# Patient Record
Sex: Male | Born: 1990 | Race: Black or African American | Hispanic: No | Marital: Single | Smoking: Current every day smoker
Health system: Southern US, Community
[De-identification: ages and names within clinical notes are randomized; demographics above are authoritative.]

## PROBLEM LIST (undated history)

## (undated) DIAGNOSIS — S42022A Displaced fracture of shaft of left clavicle, initial encounter for closed fracture: Secondary | ICD-10-CM

---
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
## 2017-05-08 ENCOUNTER — Encounter (HOSPITAL_COMMUNITY): Payer: Self-pay | Admitting: *Deleted

## 2017-05-08 ENCOUNTER — Other Ambulatory Visit: Payer: Self-pay

## 2017-05-08 ENCOUNTER — Emergency Department (HOSPITAL_COMMUNITY)
Admission: EM | Admit: 2017-05-08 | Discharge: 2017-05-09 | Disposition: A | Discharge status: Home / Self Care | Payer: Self-pay | Attending: Emergency Medicine | Admitting: Emergency Medicine

## 2017-05-08 DIAGNOSIS — F17210 Nicotine dependence, cigarettes, uncomplicated: Secondary | ICD-10-CM | POA: Insufficient documentation

## 2017-05-08 DIAGNOSIS — Z202 Contact with and (suspected) exposure to infections with a predominantly sexual mode of transmission: Secondary | ICD-10-CM | POA: Insufficient documentation

## 2017-05-08 NOTE — ED Triage Notes (Signed)
Pt's girlfriend was diagnosed with gonorrhea today. Pt has not been tested, is requesting medications. Denies urinary symptoms or discharge

## 2017-05-09 ENCOUNTER — Encounter (HOSPITAL_COMMUNITY): Payer: Self-pay | Admitting: Student

## 2017-05-09 LAB — URINALYSIS, ROUTINE W REFLEX MICROSCOPIC
Bilirubin Urine: NEGATIVE
Glucose, UA: NEGATIVE mg/dL
Hgb urine dipstick: NEGATIVE
KETONES UR: NEGATIVE mg/dL
LEUKOCYTES UA: NEGATIVE
NITRITE: NEGATIVE
PROTEIN: NEGATIVE mg/dL
Specific Gravity, Urine: 1.029 (ref 1.005–1.030)
pH: 5 (ref 5.0–8.0)

## 2017-05-09 LAB — GC/CHLAMYDIA PROBE AMP (~~LOC~~) NOT AT ARMC
CHLAMYDIA, DNA PROBE: NEGATIVE
NEISSERIA GONORRHEA: NEGATIVE

## 2017-05-09 MED ORDER — AZITHROMYCIN 250 MG PO TABS
1000.0000 mg | ORAL_TABLET | Freq: Once | ORAL | Status: AC
  Administered 2017-05-09: 1000 mg via ORAL
  Filled 2017-05-09: qty 4

## 2017-05-09 MED ORDER — CEFTRIAXONE SODIUM 250 MG IJ SOLR
250.0000 mg | Freq: Once | INTRAMUSCULAR | Status: AC
  Administered 2017-05-09: 250 mg via INTRAMUSCULAR
  Filled 2017-05-09: qty 250

## 2017-05-09 MED ORDER — LIDOCAINE HCL (PF) 1 % IJ SOLN
5.0000 mL | Freq: Once | INTRAMUSCULAR | Status: AC
Start: 2017-05-09 — End: 2017-05-09
  Administered 2017-05-09: 5 mL
  Filled 2017-05-09: qty 5

## 2017-05-09 NOTE — ED Provider Notes (Signed)
[facility] EMERGENCY DEPARTMENT Provider Note   CSN: [id number] Arrival date & time: 05/08/17  2039     History   Chief Complaint Chief Complaint  Patient presents with  . Exposure to STD    HPI DELDRICK Graham is a 27 y.o. male with a hx tobacco abuse who presents to the ED requesting STD prophlaxis treatment today after girlfriend tested positive for gonorrhea. Patient states he has had some intermittent urinary frequency and urgency over past 2 days, no alleviating/aggravating factors, no other sxs. Denies fever, chills, nausea, vomiting, penile discharge, abdominal pain, testicular pain/swelling, or pain with bowel movements.   HPI  History reviewed. No pertinent past medical history.  There are no active problems to display for this patient.   History reviewed. No pertinent surgical history.     Home Medications    Prior to Admission medications   Not on File    Family History History reviewed. No pertinent family history.  Social History Social History   Tobacco Use  . Smoking status: Current Every Day Smoker  . Smokeless tobacco: Never Used  Substance Use Topics  . Alcohol use: Yes  . Drug use: No     Allergies   Patient has no known allergies.   Review of Systems Review of Systems  Constitutional: Negative for chills and fever.  Gastrointestinal: Negative for abdominal pain, constipation, diarrhea, nausea and vomiting.  Genitourinary: Positive for frequency and urgency. Negative for discharge, dysuria, flank pain, genital sores, hematuria, penile pain, scrotal swelling and testicular pain.    Physical Exam Updated Vital Signs BP 135/75   Pulse 66   Temp 98.8 F (37.1 C)   Resp 16   SpO2 100%   Physical Exam  Constitutional: He appears well-developed and well-nourished. No distress.  HENT:  Head: Normocephalic and atraumatic.  Eyes: Conjunctivae are normal. Right eye exhibits no discharge. Left eye exhibits no  discharge.  Pulmonary/Chest: Effort normal.  Abdominal: Soft. He exhibits no distension. There is no tenderness. There is no rebound and no guarding.  Genitourinary: Testes normal. Right testis shows no mass, no swelling and no tenderness. Left testis shows no mass, no swelling and no tenderness. Circumcised. No penile erythema or penile tenderness. No discharge found.  Genitourinary Comments: RN Ricky Graham present throughout exam as chaperone.   Neurological: He is alert.  Clear speech.   Psychiatric: He has a normal mood and affect. His behavior is normal. Thought content normal.  Nursing note and vitals reviewed.   ED Treatments / Results  Labs Results for orders placed or performed during the hospital encounter of 05/08/17  Urinalysis, Routine w reflex microscopic  Result Value Ref Range   Color, Urine YELLOW YELLOW   APPearance CLEAR CLEAR   Specific Gravity, Urine 1.029 1.005 - 1.030   pH 5.0 5.0 - 8.0   Glucose, UA NEGATIVE NEGATIVE mg/dL   Hgb urine dipstick NEGATIVE NEGATIVE   Bilirubin Urine NEGATIVE NEGATIVE   Ketones, ur NEGATIVE NEGATIVE mg/dL   Protein, ur NEGATIVE NEGATIVE mg/dL   Nitrite NEGATIVE NEGATIVE   Leukocytes, UA NEGATIVE NEGATIVE   No results found. EKG  EKG Interpretation None       Radiology No results found.  Procedures Procedures (including critical care time)  Medications Ordered in ED Medications  azithromycin (ZITHROMAX) tablet 1,000 mg (1,000 mg Oral Given 05/09/17 0033)  cefTRIAXone (ROCEPHIN) injection 250 mg (250 mg Intramuscular Given 05/09/17 0034)  lidocaine (PF) (XYLOCAINE) 1 % injection 5 mL (5 mLs  Other Given 05/09/17 0034)    Initial Impression / Assessment and Plan / ED Course  I have reviewed the triage vital signs and the nursing notes.  Pertinent labs & imaging results that were available during my care of the patient were reviewed by me and considered in my medical decision making (see chart for details).  Patient  presents requesting STD treatment after girlfriend tested positive for gonorrhea. Patient is nontoxic appearing, in no apparent distress, vitals are WNL. States he has had some urgency/frequency, no other complaints. Patient is afebrile without abdominal tenderness, abdominal pain or painful bowel movements to indicate prostatitis.  No tenderness to palpation of the testes or epididymis to suggest orchitis or epididymitis.  STD cultures obtained including  gonorrhea and chlamydia, patient declined testing for HIV or syphilis. UA grossly unremarkable. Patient has been treated prophylactically with azithromycin and Rocephin in the ED. Patient to be discharged with instructions to follow up with PCP or the health department. Discussed importance of using protection when sexually active. Pt understands that they have GC/Chlamydia cultures pending and that they will need to inform all sexual partners if results return positive. Discussed treatment plan, need for follow-up, and return precautions with the patient. Provided opportunity for questions, patient confirmed understanding and is in agreement with plan.   Final Clinical Impressions(s) / ED Diagnoses   Final diagnoses:  STD exposure    ED Discharge Orders    None       Cherly Andersonetrucelli, Alis Sawchuk R, PA-C 05/09/17 0046    Glynn Octaveancour, Stephen, MD 05/09/17 [phone]

## 2017-05-09 NOTE — Discharge Instructions (Signed)
You were seen in the emergency department today and treated prophylactically for gonorrhea and chlamydia, do not engage in intercourse for the next 7 days to avoid transmission if your tests are positive.  Your test results are pending, we will call you with results.  If the results are positive you will need to inform all sexual partners.   Follow-up with the health department or your primary care provider in 1 week for reevaluation.  Return to the emergency department for new or worsening symptoms including but not limited to penile discharge, abdominal pain, testicular pain or swelling, nausea, vomiting, fever, or any other concerns you may have.

## 2017-05-22 ENCOUNTER — Emergency Department (HOSPITAL_COMMUNITY)
Admission: EM | Admit: 2017-05-22 | Discharge: 2017-05-22 | Disposition: A | Discharge status: Home / Self Care | Payer: Self-pay | Attending: Emergency Medicine | Admitting: Emergency Medicine

## 2017-05-22 ENCOUNTER — Other Ambulatory Visit: Payer: Self-pay

## 2017-05-22 ENCOUNTER — Encounter (HOSPITAL_COMMUNITY): Payer: Self-pay

## 2017-05-22 DIAGNOSIS — F17210 Nicotine dependence, cigarettes, uncomplicated: Secondary | ICD-10-CM | POA: Insufficient documentation

## 2017-05-22 DIAGNOSIS — Z202 Contact with and (suspected) exposure to infections with a predominantly sexual mode of transmission: Secondary | ICD-10-CM | POA: Insufficient documentation

## 2017-05-22 MED ORDER — METRONIDAZOLE 500 MG PO TABS
2000.0000 mg | ORAL_TABLET | Freq: Once | ORAL | Status: AC
  Administered 2017-05-22: 2000 mg via ORAL
  Filled 2017-05-22: qty 4

## 2017-05-22 NOTE — Discharge Instructions (Signed)
°  Use a condom with every sexual encounter Follow up with your doctor in regards to today's visit.   Please return to the ER for fevers, vomiting, new or worsening symptoms, any additional concerns.

## 2017-05-22 NOTE — ED Provider Notes (Signed)
[facility] EMERGENCY DEPARTMENT Provider Note   CSN: [id number] Arrival date & time: 05/22/17  0216     History   Chief Complaint Chief Complaint  Patient presents with  . Exposure to STD    HPI HAYZE GAZDA is a 27 y.o. male.  The history is provided by the patient and medical records. No language interpreter was used.  Exposure to STD  Pertinent negatives include no abdominal pain.   MACKEY VARRICCHIO is a 27 y.o. male  with no known PMH who presents to the Emergency Department for STD treatment.  Patient's girlfriend was seen in the emergency department today by me and diagnosed with trichomonas.  After being informed of diagnosis, patient took 10 for treatment.  He has recently had treatment for gonorrhea and chlamydia the last 2 weeks, but not for trichomonas.  He is having no symptoms.  No abdominal pain, penile discharge, penile/scrotal pain or swelling.   History reviewed. No pertinent past medical history.  There are no active problems to display for this patient.   History reviewed. No pertinent surgical history.     Home Medications    Prior to Admission medications   Not on File    Family History No family history on file.  Social History Social History   Tobacco Use  . Smoking status: Current Every Day Smoker  . Smokeless tobacco: Never Used  Substance Use Topics  . Alcohol use: Yes  . Drug use: No     Allergies   Patient has no known allergies.   Review of Systems Review of Systems  Constitutional: Negative for chills and fever.  Gastrointestinal: Negative for abdominal pain, constipation, diarrhea, nausea and vomiting.  Genitourinary: Negative for difficulty urinating, discharge, dysuria, frequency, penile pain, penile swelling, scrotal swelling, testicular pain and urgency.  Musculoskeletal: Negative for back pain.     Physical Exam Updated Vital Signs BP 130/68 (BP Location: Right Arm)   Pulse 65   Temp 98.4 F  (36.9 C) (Oral)   Resp 18   SpO2 98%   Physical Exam  Constitutional: He appears well-developed and well-nourished. No distress.  HENT:  Head: Normocephalic and atraumatic.  Neck: Neck supple.  Cardiovascular: Normal rate, regular rhythm and normal heart sounds.  No murmur heard. Pulmonary/Chest: Effort normal and breath sounds normal. No respiratory distress. He has no wheezes. He has no rales.  Abdominal:  No abdominal tenderness.  Genitourinary:  Genitourinary Comments: Chaperone present for exam. No discharge from penis. No signs of lesion or erythema on the penis or testicles. The penis and testicles are nontender. No testicular masses or swelling.  Neurological: He is alert.  Skin: Skin is warm and dry.  Nursing note and vitals reviewed.    ED Treatments / Results  Labs (all labs ordered are listed, but only abnormal results are displayed) Labs Reviewed - No data to display  EKG  EKG Interpretation None       Radiology No results found.  Procedures Procedures (including critical care time)  Medications Ordered in ED Medications  metroNIDAZOLE (FLAGYL) tablet 2,000 mg (2,000 mg Oral Given 05/22/17 0245)     Initial Impression / Assessment and Plan / ED Course  I have reviewed the triage vital signs and the nursing notes.  Pertinent labs & imaging results that were available during my care of the patient were reviewed by me and considered in my medical decision making (see chart for details).    D[facility] presents  to ED for STD treatment.  His girlfriend was seen in the emergency department by me earlier tonight and diagnosed with trichomonas.  He is having no symptoms.  Benign exam.  Treated with Flagyl in ED.  Already received gonorrhea and Chlamydia treatment about 2 weeks ago. Health department follow-up information provided.  All questions answered.   Final Clinical Impressions(s) / ED Diagnoses   Final diagnoses:  STD exposure    ED Discharge  Orders    None       Ward, Chase PicketJaime Pilcher, PA-C 05/22/17 [id number]    Geoffery Lyonselo, Douglas, MD 05/22/17 [phone]

## 2017-05-22 NOTE — ED Triage Notes (Signed)
Pt reports that his girlfriend has tric and he wants to be treated

## 2017-06-21 ENCOUNTER — Encounter (HOSPITAL_COMMUNITY): Payer: Self-pay

## 2017-06-21 ENCOUNTER — Emergency Department (HOSPITAL_COMMUNITY): Payer: Self-pay

## 2017-06-21 ENCOUNTER — Other Ambulatory Visit: Payer: Self-pay

## 2017-06-21 ENCOUNTER — Inpatient Hospital Stay (HOSPITAL_COMMUNITY)
Admission: EM | Admit: 2017-06-21 | Discharge: 2017-06-27 | DRG: 558 | Disposition: A | Discharge status: Home / Self Care | Payer: Self-pay | Attending: Internal Medicine | Admitting: Internal Medicine

## 2017-06-21 DIAGNOSIS — M6282 Rhabdomyolysis: Principal | ICD-10-CM

## 2017-06-21 DIAGNOSIS — Y93B9 Activity, other involving muscle strengthening exercises: Secondary | ICD-10-CM

## 2017-06-21 DIAGNOSIS — F17200 Nicotine dependence, unspecified, uncomplicated: Secondary | ICD-10-CM | POA: Diagnosis present

## 2017-06-21 LAB — URINALYSIS, ROUTINE W REFLEX MICROSCOPIC
BILIRUBIN URINE: NEGATIVE
GLUCOSE, UA: NEGATIVE mg/dL
Ketones, ur: NEGATIVE mg/dL
Leukocytes, UA: NEGATIVE
NITRITE: NEGATIVE
Protein, ur: 100 mg/dL — AB
SPECIFIC GRAVITY, URINE: 1.02 (ref 1.005–1.030)
pH: 6 (ref 5.0–8.0)

## 2017-06-21 LAB — BASIC METABOLIC PANEL
ANION GAP: 12 (ref 5–15)
BUN: 10 mg/dL (ref 6–20)
CO2: 24 mmol/L (ref 22–32)
Calcium: 9.3 mg/dL (ref 8.9–10.3)
Chloride: 101 mmol/L (ref 101–111)
Creatinine, Ser: 0.76 mg/dL (ref 0.61–1.24)
Glucose, Bld: 93 mg/dL (ref 65–99)
POTASSIUM: 4.5 mmol/L (ref 3.5–5.1)
SODIUM: 137 mmol/L (ref 135–145)

## 2017-06-21 LAB — CK: Total CK: >50000 U/L — ABNORMAL HIGH (ref 49–397)

## 2017-06-21 LAB — CBC WITH DIFFERENTIAL/PLATELET
BASOS ABS: 0 10*3/uL (ref 0.0–0.1)
BASOS PCT: 0 %
EOS PCT: 1 %
Eosinophils Absolute: 0 10*3/uL (ref 0.0–0.7)
HEMATOCRIT: 49.8 % (ref 39.0–52.0)
Hemoglobin: 16.9 g/dL (ref 13.0–17.0)
LYMPHS PCT: 27 %
Lymphs Abs: 1.8 10*3/uL (ref 0.7–4.0)
MCH: 29.1 pg (ref 26.0–34.0)
MCHC: 33.9 g/dL (ref 30.0–36.0)
MCV: 85.9 fL (ref 78.0–100.0)
Monocytes Absolute: 0.5 10*3/uL (ref 0.1–1.0)
Monocytes Relative: 7 %
NEUTROS ABS: 4.3 10*3/uL (ref 1.7–7.7)
Neutrophils Relative %: 65 %
PLATELETS: 251 10*3/uL (ref 150–400)
RBC: 5.8 MIL/uL (ref 4.22–5.81)
RDW: 13.4 % (ref 11.5–15.5)
WBC: 6.7 10*3/uL (ref 4.0–10.5)

## 2017-06-21 MED ORDER — METHOCARBAMOL 500 MG PO TABS
500.0000 mg | ORAL_TABLET | Freq: Four times a day (QID) | ORAL | Status: DC | PRN
  Administered 2017-06-22 – 2017-06-23 (×5): 500 mg via ORAL
  Filled 2017-06-21 (×5): qty 1

## 2017-06-21 MED ORDER — FENTANYL CITRATE (PF) 100 MCG/2ML IJ SOLN
50.0000 ug | Freq: Once | INTRAMUSCULAR | Status: AC
  Administered 2017-06-21: 50 ug via INTRAVENOUS
  Filled 2017-06-21: qty 2

## 2017-06-21 MED ORDER — METHOCARBAMOL 500 MG PO TABS
500.0000 mg | ORAL_TABLET | Freq: Once | ORAL | Status: AC
  Administered 2017-06-21: 500 mg via ORAL
  Filled 2017-06-21: qty 1

## 2017-06-21 MED ORDER — SODIUM CHLORIDE 0.9 % IV BOLUS
1000.0000 mL | Freq: Once | INTRAVENOUS | Status: AC
  Administered 2017-06-21: 1000 mL via INTRAVENOUS

## 2017-06-21 MED ORDER — HYDROMORPHONE HCL 1 MG/ML IJ SOLN
0.5000 mg | INTRAMUSCULAR | Status: DC | PRN
  Administered 2017-06-22: 0.5 mg via INTRAVENOUS
  Filled 2017-06-21: qty 1

## 2017-06-21 NOTE — H&P (Signed)
[facility] H&P   Patient Demographics:    Ricky Graham, is a 27 y.o. male  MRN: [id number]   DOB - 1990/10/18  Admit Date - 06/21/2017  Outpatient Primary MD for the patient is Patient, No Pcp Per  Referring MD/NP/PA:  Providence Lanius  Outpatient Specialists:   Patient coming from: home  Chief Complaint  Patient presents with  . bilateral arm pain      HPI:    Ricky Graham  is a 27 y.o. male, w no significant PMHx, who presents with c/o achiness of his arms bilaterally.  Pt denies being on any medication, no coccaine use.  Pt denies dehydration.  Does admit to lifting a few days ago. No family hx of rhabdomyolysis.   In ED,  Xray left elbow IMPRESSION: Negative.   Na 137, K 4.5,   Bun 10, Creatinine 0.76 Wbc 6.7, Hgb 16.9, Plt 251  CPK >50,000  Pt admits to lifting 80lbs at work all the time.  Pt will be admitted for rhabdomyolysis.           Review of systems:    In addition to the HPI above,  No Fever-chills, No Headache, No changes with Vision or hearing, No problems swallowing food or Liquids, No Chest pain, Cough or Shortness of Breath, No Abdominal pain, No Nausea or Vommitting, Bowel movements are regular, No Blood in stool or Urine, No dysuria, No new skin rashes or bruises, No new joints pains-aches,   No recent weight gain or loss, No polyuria, polydypsia or polyphagia, No significant Mental Stressors.  A full 10 point Review of Systems was done, except as stated above, all other Review of Systems were negative.   With Past History of the following :    History reviewed. No pertinent past medical history.    History reviewed. No pertinent surgical history.    Social History:     Social History   Tobacco Use  . Smoking status: Current Every Day Smoker  . Smokeless tobacco: Never Used  Substance Use Topics  . Alcohol use: Yes       Lives - at home  Mobility - walks by self   Family History :     Family History  Problem Relation Age of Onset  . Hypertension Mother      Home Medications:   Prior to Admission medications   Not on File     Allergies:    No Known Allergies   Physical Exam:   Vitals  Blood pressure (!) 136/103, pulse 60, temperature 98.3 F (36.8 C), temperature source Oral, resp. rate 16, height 6' (1.829 m), weight 81.6 kg (180 lb), SpO2 100 %.   1. General  lying in bed in NAD,    2. Normal affect and insight, Not Suicidal or Homicidal, Awake Alert, Oriented X 3.  3. No F.N deficits, ALL C.Nerves Intact, Strength 5/5  all 4 extremities, Sensation intact all 4 extremities, Plantars down going.  4. Ears and Eyes appear Normal, Conjunctivae clear, PERRLA. Moist Oral Mucosa.  5. Supple Neck, No JVD, No cervical lymphadenopathy appriciated, No Carotid Bruits.  6. Symmetrical Chest wall movement, Good air movement bilaterally, CTAB.  7. RRR, No Gallops, Rubs or Murmurs, No Parasternal Heave.  8. Positive Bowel Sounds, Abdomen Soft, No tenderness, No organomegaly appriciated,No rebound -guarding or rigidity.  9.  No Cyanosis, Normal Skin Turgor, No Skin Rash or Bruise.  10. Good muscle tone,  joints appear normal , no effusions, Normal ROM.  11. No Palpable Lymph Nodes in Neck or Axillae     Data Review:    CBC Recent Labs  Lab 06/21/17 1748  WBC 6.7  HGB 16.9  HCT 49.8  PLT 251  MCV 85.9  MCH 29.1  MCHC 33.9  RDW 13.4  LYMPHSABS 1.8  MONOABS 0.5  EOSABS 0.0  BASOSABS 0.0   ------------------------------------------------------------------------------------------------------------------  Chemistries  Recent Labs  Lab 06/21/17 1748  NA 137  K 4.5  CL 101  CO2 24  GLUCOSE 93  BUN 10  CREATININE 0.76  CALCIUM 9.3   ------------------------------------------------------------------------------------------------------------------ estimated creatinine  clearance is 156.3 mL/min (by C-G formula based on SCr of 0.76 mg/dL). ------------------------------------------------------------------------------------------------------------------ No results for input(s): TSH, T4TOTAL, T3FREE, THYROIDAB in the last 72 hours.  Invalid input(s): FREET3  Coagulation profile No results for input(s): INR, PROTIME in the last 168 hours. ------------------------------------------------------------------------------------------------------------------- No results for input(s): DDIMER in the last 72 hours. -------------------------------------------------------------------------------------------------------------------  Cardiac Enzymes No results for input(s): CKMB, TROPONINI, MYOGLOBIN in the last 168 hours.  Invalid input(s): CK ------------------------------------------------------------------------------------------------------------------ No results found for: BNP   ---------------------------------------------------------------------------------------------------------------  Urinalysis    Component Value Date/Time   COLORURINE YELLOW 06/21/2017 2009   APPEARANCEUR CLEAR 06/21/2017 2009   LABSPEC 1.020 06/21/2017 2009   PHURINE 6.0 06/21/2017 2009   GLUCOSEU NEGATIVE 06/21/2017 2009   HGBUR LARGE (A) 06/21/2017 2009   BILIRUBINUR NEGATIVE 06/21/2017 2009   KETONESUR NEGATIVE 06/21/2017 2009   PROTEINUR 100 (A) 06/21/2017 2009   NITRITE NEGATIVE 06/21/2017 2009   LEUKOCYTESUR NEGATIVE 06/21/2017 2009    ----------------------------------------------------------------------------------------------------------------   Imaging Results:    Dg Elbow Complete Left  Result Date: 06/21/2017 CLINICAL DATA:  Elbow pain EXAM: LEFT ELBOW - COMPLETE 3+ VIEW COMPARISON:  None. FINDINGS: There is no evidence of fracture, dislocation, or joint effusion. There is no evidence of arthropathy or other focal bone abnormality. Soft tissues are unremarkable.  IMPRESSION: Negative. Electronically Signed   By: Donavan Foil M.D.   On: 06/21/2017 18:58   Dg Shoulder Left  Result Date: 06/21/2017 CLINICAL DATA:  Shoulder pain EXAM: LEFT SHOULDER - 2+ VIEW COMPARISON:  None. FINDINGS: There is no evidence of fracture or dislocation. There is no evidence of arthropathy or other focal bone abnormality. Soft tissues are unremarkable. IMPRESSION: Negative. Electronically Signed   By: Donavan Foil M.D.   On: 06/21/2017 18:58       Assessment & Plan:    Principal Problem:   Rhabdomyolysis    Rhabdomyolysis Hydrate with ns iv Check CPK in am Check cmp in am Check ESR, ANA, TSH    DVT Prophylaxis Lovenox - SCDs  AM Labs Ordered, also please review Full Orders  Family Communication: Admission, patients condition and plan of care including tests being ordered have been discussed with the patient  who indicate understanding and agree with the plan and Code Status.  Code Status FULL CODE  Likely  DC to  home  Condition GUARDED    Consults called: none  Admission status: inpatient   Time spent in minutes : 45   Jani Gravel M.D on 06/21/2017 at 9:57 PM  Between 7am to 7pm - Pager - [phone] . After 7pm go to [email or web address] - password [facility]  [facility] - Office  [phone]

## 2017-06-21 NOTE — ED Triage Notes (Signed)
Patient complains of bilateral upper arm pain from lifting all day at work. States that he lifts approximately 80lbs at a time and pain now with any bending of arms.

## 2017-06-21 NOTE — ED Provider Notes (Addendum)
[facility] EMERGENCY DEPARTMENT Provider Note   CSN: [id number] Arrival date & time: 06/21/17  1355     History   Chief Complaint Chief Complaint  Patient presents with  . Arm Pain    HPI Ricky Graham is a 27 y.o. male with no significant past medical history who presents for evaluation of bilateral upper extremity pain, left greater than right that has been ongoing for the last 4 days.  Patient states that prior to onset of symptoms, he was at work and did a lot of strenuous lifting.  Patient states that he was lifting, pushing and pulling approximately 80 pounds throughout the day.  Patient states that immediately after, he engaged in a strenuous upper extremity workout.  Patient reports that since then he has had pain in the bilateral upper extremities.  He states that the pain is worse in the left upper extremity.  He states that pain is worse with movement and reports difficulty moving moving arm secondary to pain.  Patient states that he feels like the arms are swollen but denies any warmth or redness.  Patient states he took 1 dose of ibuprofen at onset of symptoms but has not taken anything else for pain.  Patient denies any fevers, numbness/weakness, preceding trauma, injury, chest pain, difficulty breathing, nausea/vomiting.   The history is provided by the patient.    History reviewed. No pertinent past medical history.  Patient Active Problem List   Diagnosis Date Noted  . Rhabdomyolysis 06/21/2017    History reviewed. No pertinent surgical history.      Home Medications    Prior to Admission medications   Not on File    Family History Family History  Problem Relation Age of Onset  . Hypertension Mother     Social History Social History   Tobacco Use  . Smoking status: Current Every Day Smoker  . Smokeless tobacco: Never Used  Substance Use Topics  . Alcohol use: Yes  . Drug use: No     Allergies   Patient has no known  allergies.   Review of Systems Review of Systems  Constitutional: Negative for chills and fever.  Respiratory: Negative for cough and shortness of breath.   Cardiovascular: Negative for chest pain.  Gastrointestinal: Negative for abdominal pain, diarrhea, nausea and vomiting.  Genitourinary: Negative for dysuria and hematuria.  Musculoskeletal: Positive for joint swelling and myalgias. Negative for back pain and neck pain.  Skin: Negative for color change.  Neurological: Negative for dizziness, weakness, numbness and headaches.  All other systems reviewed and are negative.    Physical Exam Updated Vital Signs BP (!) 136/103 (BP Location: Right Arm)   Pulse 60   Temp 98.3 F (36.8 C) (Oral)   Resp 16   Ht 6' (1.829 m)   Wt 81.6 kg (180 lb)   SpO2 100%   BMI 24.41 kg/m   Physical Exam  Constitutional: He is oriented to person, place, and time. He appears well-developed and well-nourished.  HENT:  Head: Normocephalic and atraumatic.  Mouth/Throat: Oropharynx is clear and moist and mucous membranes are normal.  Eyes: Pupils are equal, round, and reactive to light. Conjunctivae, EOM and lids are normal.  Neck: Full passive range of motion without pain.  Cardiovascular: Normal rate, regular rhythm, normal heart sounds and normal pulses. Exam reveals no gallop and no friction rub.  No murmur heard. Pulses:      Radial pulses are 2+ on the right side, and 2+ on  the left side.  Pulmonary/Chest: Effort normal and breath sounds normal.    Abdominal: Soft. Normal appearance. There is no tenderness. There is no rigidity and no guarding.  Musculoskeletal: Normal range of motion.  Soft compartments to both the LUE and RUE. Diffuse tenderness to palpation throughout the entire right upper extremity with no focal point.  No bony tenderness.  No tenderness palpation to the right shoulder, right elbow, right wrist.  Full range of motion of right elbow and right wrist without any  difficulty.  Limited range of motion of right shoulder secondary to pain.  Diffuse tenderness to the left upper extremity, most notably in the area of the biceps muscle.  No deformity or crepitus noted.  Limited range of left shoulder secondary to patient's pain.  Limited flexion/extension of left elbow secondary to patient's pain.  No tenderness palpation to the left wrist.  Neurological: He is alert and oriented to person, place, and time.  Skin: Skin is warm and dry. Capillary refill takes less than 2 seconds.  Good distal cap refill. BUE is not dusky in appearance or cool to touch.  Psychiatric: He has a normal mood and affect. His speech is normal.  Nursing note and vitals reviewed.    ED Treatments / Results  Labs (all labs ordered are listed, but only abnormal results are displayed) Labs Reviewed  CK - Abnormal; Notable for the following components:      Result Value   Total CK >50,000 (*)    All other components within normal limits  URINALYSIS, ROUTINE W REFLEX MICROSCOPIC - Abnormal; Notable for the following components:   Hgb urine dipstick LARGE (*)    Protein, ur 100 (*)    Bacteria, UA RARE (*)    Squamous Epithelial / LPF 0-5 (*)    All other components within normal limits  BASIC METABOLIC PANEL  CBC WITH DIFFERENTIAL/PLATELET  RAPID URINE DRUG SCREEN, HOSP PERFORMED    EKG None  Radiology Dg Elbow Complete Left  Result Date: 06/21/2017 CLINICAL DATA:  Elbow pain EXAM: LEFT ELBOW - COMPLETE 3+ VIEW COMPARISON:  None. FINDINGS: There is no evidence of fracture, dislocation, or joint effusion. There is no evidence of arthropathy or other focal bone abnormality. Soft tissues are unremarkable. IMPRESSION: Negative. Electronically Signed   By: Jasmine Pang M.D.   On: 06/21/2017 18:58   Dg Shoulder Left  Result Date: 06/21/2017 CLINICAL DATA:  Shoulder pain EXAM: LEFT SHOULDER - 2+ VIEW COMPARISON:  None. FINDINGS: There is no evidence of fracture or dislocation.  There is no evidence of arthropathy or other focal bone abnormality. Soft tissues are unremarkable. IMPRESSION: Negative. Electronically Signed   By: Jasmine Pang M.D.   On: 06/21/2017 18:58    Procedures Procedures (including critical care time)  Medications Ordered in ED Medications  sodium chloride 0.9 % bolus 1,000 mL (0 mLs Intravenous Stopped 06/21/17 1902)  fentaNYL (SUBLIMAZE) injection 50 mcg (50 mcg Intravenous Given 06/21/17 1802)  methocarbamol (ROBAXIN) tablet 500 mg (500 mg Oral Given 06/21/17 1802)  fentaNYL (SUBLIMAZE) injection 50 mcg (50 mcg Intravenous Given 06/21/17 1956)     Initial Impression / Assessment and Plan / ED Course  I have reviewed the triage vital signs and the nursing notes.  Pertinent labs & imaging results that were available during my care of the patient were reviewed by me and considered in my medical decision making (see chart for details).     27 year old male who presents for evaluation of bilateral upper  extremity pain that began 4 days ago.  Symptoms began after lifting 80 pounds throughout the day at work.  Additionally, patient had a strenuous exercise after working.  No fevers. Patient is afebrile, non-toxic appearing, sitting comfortably on examination table. Vital signs reviewed and stable. Patient is neurovascularly intact.  On exam, patient has diffuse muscular tenderness overlying bilateral upper extremities,   Worse to the left upper extremity, particularly at the left shoulder.  Patient has some mild swelling overlying the left pectoralis major that extends into the shoulder but no overlying warmth, erythema that would be concerned about septic arthritis.  History/physical exam is not concerning for DVT.  No evidence of biceps tendon tear.  Consider muscular strain versus rhabdomyolysis.   Plan to check basic labs, urine, x-rays. IVF given for fluid resuscitation. Analgesics provided in the department.  CBC is without any significant  leukocytosis, anemia.  BMP is without any acute kidney injury, acute abnormalities.  UA shows large hemoglobin but only 0-5 red blood cells.  Concerning for rhabdomyolysis.  CKs greater than 50,000. Left elbow x-rays negative for any acute abnormalities.  Left shoulder is negative for any acute abnormalities.  Given concerns for rhabdomyolysis, will plan to admit.  Discussed with hospitalist.  Agrees with admission.   Final Clinical Impressions(s) / ED Diagnoses   Final diagnoses:  Non-traumatic rhabdomyolysis    ED Discharge Orders    None       Maxwell CaulLayden, Zamar Odwyer A, PA-C 06/21/17 2200    Maxwell CaulLayden, Darienne Belleau A, PA-C 06/21/17 2202    Mancel BaleWentz, Elliott, MD 06/21/17 2329

## 2017-06-21 NOTE — ED Notes (Signed)
Pt informed that he needs to provide a UA when possible. Pt verbalized understanding.

## 2017-06-22 ENCOUNTER — Other Ambulatory Visit: Payer: Self-pay

## 2017-06-22 LAB — COMPREHENSIVE METABOLIC PANEL
ALBUMIN: 3.2 g/dL — AB (ref 3.5–5.0)
ALT: 281 U/L — ABNORMAL HIGH (ref 17–63)
ANION GAP: 6 (ref 5–15)
AST: 795 U/L — ABNORMAL HIGH (ref 15–41)
Alkaline Phosphatase: 87 U/L (ref 38–126)
BILIRUBIN TOTAL: 0.6 mg/dL (ref 0.3–1.2)
BUN: 10 mg/dL (ref 6–20)
CALCIUM: 8.3 mg/dL — AB (ref 8.9–10.3)
CO2: 27 mmol/L (ref 22–32)
Chloride: 106 mmol/L (ref 101–111)
Creatinine, Ser: 0.84 mg/dL (ref 0.61–1.24)
GLUCOSE: 107 mg/dL — AB (ref 65–99)
POTASSIUM: 4.2 mmol/L (ref 3.5–5.1)
Sodium: 139 mmol/L (ref 135–145)
Total Protein: 5.7 g/dL — ABNORMAL LOW (ref 6.5–8.1)

## 2017-06-22 LAB — CBC
HCT: 41.2 % (ref 39.0–52.0)
HEMOGLOBIN: 13.7 g/dL (ref 13.0–17.0)
MCH: 28.7 pg (ref 26.0–34.0)
MCHC: 33.3 g/dL (ref 30.0–36.0)
MCV: 86.4 fL (ref 78.0–100.0)
Platelets: 224 10*3/uL (ref 150–400)
RBC: 4.77 MIL/uL (ref 4.22–5.81)
RDW: 13.7 % (ref 11.5–15.5)
WBC: 7.5 10*3/uL (ref 4.0–10.5)

## 2017-06-22 LAB — RAPID URINE DRUG SCREEN, HOSP PERFORMED
Amphetamines: NOT DETECTED
Barbiturates: NOT DETECTED
Benzodiazepines: NOT DETECTED
COCAINE: NOT DETECTED
Opiates: NOT DETECTED
Tetrahydrocannabinol: NOT DETECTED

## 2017-06-22 LAB — TSH: TSH: 2.027 u[IU]/mL (ref 0.350–4.500)

## 2017-06-22 LAB — SEDIMENTATION RATE: SED RATE: 0 mm/h (ref 0–16)

## 2017-06-22 LAB — HIV ANTIBODY (ROUTINE TESTING W REFLEX): HIV Screen 4th Generation wRfx: NONREACTIVE

## 2017-06-22 MED ORDER — ENOXAPARIN SODIUM 40 MG/0.4ML ~~LOC~~ SOLN
40.0000 mg | SUBCUTANEOUS | Status: DC
  Filled 2017-06-22 (×2): qty 0.4

## 2017-06-22 MED ORDER — ACETAMINOPHEN 325 MG PO TABS
650.0000 mg | ORAL_TABLET | Freq: Four times a day (QID) | ORAL | Status: DC | PRN
  Administered 2017-06-22: 650 mg via ORAL
  Filled 2017-06-22: qty 2

## 2017-06-22 MED ORDER — ACETAMINOPHEN 650 MG RE SUPP: 650.0000 mg | Freq: Four times a day (QID) | RECTAL | Status: DC | PRN

## 2017-06-22 MED ORDER — SODIUM CHLORIDE 0.9 % IV SOLN
INTRAVENOUS | Status: AC
  Administered 2017-06-22 – 2017-06-23 (×5): via INTRAVENOUS

## 2017-06-22 NOTE — ED Notes (Signed)
Patient complained of IV in his arm and states that it has been there since "2 o'clock today". Advised patient that he has the right to refuse it, but that he needs the fluids. Also told patient that I was happy to move IV to another site. Patient also requested more of the "D medicine that I got earlier". Patient advised that this medication is ordered every 4 hours and he can have a muscle relaxer every 6 hours. Patient offered Tylenol for pain control which he declined. Will continue to monitor.

## 2017-06-22 NOTE — Progress Notes (Addendum)
PROGRESS NOTE    [name]  ZOX:[id number] DOB: [birth year] DOA: 06/21/2017 PCP: Patient, No Pcp Per  Brief Narrative:Ricky Graham  is a 27 y.o. male, w no significant PMHx, who works in a factory, lifting heavy weights and subsequently on Sunday went to the gym and worked out a lot, presented with c/o achiness of his arms bilaterally, found to have rhabdomyolysis, CK greater than 50,000  Assessment & Plan:   Principal Problem:   Rhabdomyolysis -acute on chronic likely -Suspect this event was triggered by upper body workout in the gym on Sunday -CK above measurable range, > 50K -denies cocaine use -Continue aggressive IV fluids -Monitor CK daily -Home when levels trending down  DVT prophylaxis:Lovenox Code Status: full code Family Communication:girlfriend at bedside Disposition Plan: home pending improvement in rhabdomyolysis   Antimicrobials:    Subjective: -arm aches and pains, swelling starting to improve  Objective: Vitals:   06/22/17 0700 06/22/17 0900 06/22/17 1006 06/22/17 1014  BP: 117/68 140/85 121/78   Pulse: 64 84 77   Resp: 12 17 17    Temp:   98.6 F (37 C)   TempSrc:   Oral   SpO2: 97% 98% 100%   Weight:    82.5 kg (181 lb 13 oz)  Height:    6' (1.829 m)    Intake/Output Summary (Last 24 hours) at 06/22/2017 1246 Last data filed at 06/21/2017 1902 Gross per 24 hour  Intake 1000 ml  Output -  Net 1000 ml   Filed Weights   06/21/17 1750 06/22/17 1014  Weight: 81.6 kg (180 lb) 82.5 kg (181 lb 13 oz)    Examination:  General exam: Appears calm and comfortable, well-built male, laying in bed, no distress Respiratory system: Clear to auscultation. Respiratory effort normal. Cardiovascular system: S1 & S2 heard, RRR. No JVD, murmurs, rubs, gallops Gastrointestinal system: Abdomen is nondistended, soft and nontender.Normal bowel sounds heard. Central nervous system: Alert and oriented. No focal neurological deficits. Extremities: both upper arms  with swelling and tenderness especially in proximal muscle groups Skin: No rashes, lesions or ulcers Psychiatry: Judgement and insight appear normal. Mood & affect appropriate.     Data Reviewed:   CBC: Recent Labs  Lab 06/21/17 1748 06/22/17 0746  WBC 6.7 7.5  NEUTROABS 4.3  --   HGB 16.9 13.7  HCT 49.8 41.2  MCV 85.9 86.4  PLT 251 224   Basic Metabolic Panel: Recent Labs  Lab 06/21/17 1748 06/22/17 0746  NA 137 139  K 4.5 4.2  CL 101 106  CO2 24 27  GLUCOSE 93 107*  BUN 10 10  CREATININE 0.76 0.84  CALCIUM 9.3 8.3*   GFR: Estimated Creatinine Clearance: 148.8 mL/min (by C-G formula based on SCr of 0.84 mg/dL). Liver Function Tests: Recent Labs  Lab 06/22/17 0746  AST 795*  ALT 281*  ALKPHOS 87  BILITOT 0.6  PROT 5.7*  ALBUMIN 3.2*   No results for input(s): LIPASE, AMYLASE in the last 168 hours. No results for input(s): AMMONIA in the last 168 hours. Coagulation Profile: No results for input(s): INR, PROTIME in the last 168 hours. Cardiac Enzymes: Recent Labs  Lab 06/21/17 1748  CKTOTAL >50,000*   BNP (last 3 results) No results for input(s): PROBNP in the last 8760 hours. HbA1C: No results for input(s): HGBA1C in the last 72 hours. CBG: No results for input(s): GLUCAP in the last 168 hours. Lipid Profile: No results for input(s): CHOL, HDL, LDLCALC, TRIG, CHOLHDL, LDLDIRECT in the last 72  hours. Thyroid Function Tests: Recent Labs    06/22/17 0746  TSH 2.027   Anemia Panel: No results for input(s): VITAMINB12, FOLATE, FERRITIN, TIBC, IRON, RETICCTPCT in the last 72 hours. Urine analysis:    Component Value Date/Time   COLORURINE YELLOW 06/21/2017 2009   APPEARANCEUR CLEAR 06/21/2017 2009   LABSPEC 1.020 06/21/2017 2009   PHURINE 6.0 06/21/2017 2009   GLUCOSEU NEGATIVE 06/21/2017 2009   HGBUR LARGE (A) 06/21/2017 2009   BILIRUBINUR NEGATIVE 06/21/2017 2009   KETONESUR NEGATIVE 06/21/2017 2009   PROTEINUR 100 (A) 06/21/2017 2009    NITRITE NEGATIVE 06/21/2017 2009   LEUKOCYTESUR NEGATIVE 06/21/2017 2009   Sepsis Labs: @LABRCNTIP (procalcitonin:4,lacticidven:4)  )No results found for this or any previous visit (from the past 240 hour(s)).       Radiology Studies: Dg Elbow Complete Left  Result Date: 06/21/2017 CLINICAL DATA:  Elbow pain EXAM: LEFT ELBOW - COMPLETE 3+ VIEW COMPARISON:  None. FINDINGS: There is no evidence of fracture, dislocation, or joint effusion. There is no evidence of arthropathy or other focal bone abnormality. Soft tissues are unremarkable. IMPRESSION: Negative. Electronically Signed   By: Jasmine Pang M.D.   On: 06/21/2017 18:58   Dg Shoulder Left  Result Date: 06/21/2017 CLINICAL DATA:  Shoulder pain EXAM: LEFT SHOULDER - 2+ VIEW COMPARISON:  None. FINDINGS: There is no evidence of fracture or dislocation. There is no evidence of arthropathy or other focal bone abnormality. Soft tissues are unremarkable. IMPRESSION: Negative. Electronically Signed   By: Jasmine Pang M.D.   On: 06/21/2017 18:58        Scheduled Meds: . enoxaparin (LOVENOX) injection  40 mg Subcutaneous Q24H   Continuous Infusions: . sodium chloride 175 mL/hr at 06/22/17 0144     LOS: 1 day    Time spent:    Zannie Cove, MD [facility] Page via [email or web address], password [email or web address] After 7PM please contact night-coverage  06/22/2017, 12:46 PM

## 2017-06-23 LAB — BASIC METABOLIC PANEL
Anion gap: 7 (ref 5–15)
BUN: 11 mg/dL (ref 6–20)
CO2: 25 mmol/L (ref 22–32)
CREATININE: 0.87 mg/dL (ref 0.61–1.24)
Calcium: 8.6 mg/dL — ABNORMAL LOW (ref 8.9–10.3)
Chloride: 106 mmol/L (ref 101–111)
GFR calc Af Amer: >60 mL/min (ref >60)
GLUCOSE: 97 mg/dL (ref 65–99)
Potassium: 4.1 mmol/L (ref 3.5–5.1)
SODIUM: 138 mmol/L (ref 135–145)

## 2017-06-23 LAB — CK

## 2017-06-23 MED ORDER — SODIUM CHLORIDE 0.9 % IV BOLUS
1000.0000 mL | Freq: Once | INTRAVENOUS | Status: AC
  Administered 2017-06-23: 1000 mL via INTRAVENOUS

## 2017-06-23 MED ORDER — METHOCARBAMOL 750 MG PO TABS
750.0000 mg | ORAL_TABLET | Freq: Four times a day (QID) | ORAL | Status: DC | PRN
  Administered 2017-06-23 – 2017-06-27 (×7): 750 mg via ORAL
  Filled 2017-06-23 (×8): qty 1

## 2017-06-23 NOTE — Progress Notes (Signed)
PROGRESS NOTE    Ricky Graham  WUJ:[id number] DOB: Mar 27, 1991 DOA: 06/21/2017 PCP: Patient, No Pcp Per    Brief Narrative: Ricky Graham  is a 27 y.o. 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  Assessment & Plan:   Principal Problem:  #Rhabdomyolysis, improving Acute on chronic. Suspect this event was triggered by upper body workout in the gym on Sunday in the setting of poor hydration in addition to strenuous daytime job (heavy lifting). CK continue tobe high this morning >50,000. Patient has improvement in upper extremities myalgia. Kidney function is within normal limit. --Bolus patient 1L NS this am --Increase rate from 175 to 200 cc/hr  --Trend CK  -- Will be discharged when CK improve. --Patient will need to establish care with PCP to follow up on kidney function.  DVT prophylaxis:Lovenox Code Status: full code Family Communication:girlfriend at bedside Disposition Plan: home pending improvement in rhabdomyolysis   Antimicrobials:  None   Subjective: Significant improvement in arm pain.patient continue to take good po and staying hydrating. No other acute complaint this morning  Objective: Vitals:   06/22/17 1343 06/22/17 2202 06/23/17 0500 06/23/17 0607  BP: 127/72 136/84  130/77  Pulse: (!) 50 68  61  Resp: 20 18  18   Temp: 97.7 F (36.5 C) 98.1 F (36.7 C)  98.2 F (36.8 C)  TempSrc: Oral Oral  Oral  SpO2: 100% 100%  100%  Weight:   189 lb 6 oz (85.9 kg)   Height:        Intake/Output Summary (Last 24 hours) at 06/23/2017 1105 Last data filed at 06/23/2017 0946 Gross per 24 hour  Intake 6141.67 ml  Output 1150 ml  Net 4991.67 ml   Filed Weights   06/21/17 1750 06/22/17 1014 06/23/17 0500  Weight: 180 lb (81.6 kg) 181 lb 13 oz (82.5 kg) 189 lb 6 oz  (85.9 kg)    Examination:  General exam: Appears calm and comfortable, well-built male, laying in bed, no distress Respiratory system: Clear to auscultation. Respiratory effort normal. Cardiovascular system: S1 & S2 heard, RRR. No JVD, murmurs, rubs, gallops Gastrointestinal system: Abdomen is nondistended, soft and nontender.Normal bowel sounds heard. Central nervous system: Alert and oriented. No focal neurological deficits. Extremities: both upper arms with minimal swelling and minimal tenderness noted on exam Skin: No rashes, lesions or ulcers Psychiatry: Judgement and insight appear normal. Mood & affect appropriate.     Data Reviewed:   CBC: Recent Labs  Lab 06/21/17 1748 06/22/17 0746  WBC 6.7 7.5  NEUTROABS 4.3  --   HGB 16.9 13.7  HCT 49.8 41.2  MCV 85.9 86.4  PLT 251 224   Basic Metabolic Panel: Recent Labs  Lab 06/21/17 1748 06/22/17 0746 06/23/17 0550  NA 137 139 138  K 4.5 4.2 4.1  CL 101 106 106  CO2 24 27 25   GLUCOSE 93 107* 97  BUN 10 10 11   CREATININE 0.76 0.84 0.87  CALCIUM 9.3 8.3* 8.6*   GFR: Estimated Creatinine Clearance: 143.7 mL/min (by C-G formula based on SCr of 0.87 mg/dL). Liver Function Tests: Recent Labs  Lab 06/22/17 0746  AST 795*  ALT 281*  ALKPHOS 87  BILITOT 0.6  PROT 5.7*  ALBUMIN 3.2*   No results for input(s): LIPASE, AMYLASE in the last 168  hours. No results for input(s): AMMONIA in the last 168 hours. Coagulation Profile: No results for input(s): INR, PROTIME in the last 168 hours. Cardiac Enzymes: Recent Labs  Lab 06/21/17 1748 06/23/17 0550  CKTOTAL >50,000* >50,000*   BNP (last 3 results) No results for input(s): PROBNP in the last 8760 hours. HbA1C: No results for input(s): HGBA1C in the last 72 hours. CBG: No results for input(s): GLUCAP in the last 168 hours. Lipid Profile: No results for input(s): CHOL, HDL, LDLCALC, TRIG, CHOLHDL, LDLDIRECT in the last 72 hours. Thyroid Function Tests: Recent  Labs    06/22/17 0746  TSH 2.027   Anemia Panel: No results for input(s): VITAMINB12, FOLATE, FERRITIN, TIBC, IRON, RETICCTPCT in the last 72 hours. Urine analysis:    Component Value Date/Time   COLORURINE YELLOW 06/21/2017 2009   APPEARANCEUR CLEAR 06/21/2017 2009   LABSPEC 1.020 06/21/2017 2009   PHURINE 6.0 06/21/2017 2009   GLUCOSEU NEGATIVE 06/21/2017 2009   HGBUR LARGE (A) 06/21/2017 2009   BILIRUBINUR NEGATIVE 06/21/2017 2009   KETONESUR NEGATIVE 06/21/2017 2009   PROTEINUR 100 (A) 06/21/2017 2009   NITRITE NEGATIVE 06/21/2017 2009   LEUKOCYTESUR NEGATIVE 06/21/2017 2009   Sepsis Labs: @LABRCNTIP (procalcitonin:4,lacticidven:4)  )No results found for this or any previous visit (from the past 240 hour(s)).       Radiology Studies: Dg Elbow Complete Left  Result Date: 06/21/2017 CLINICAL DATA:  Elbow pain EXAM: LEFT ELBOW - COMPLETE 3+ VIEW COMPARISON:  None. FINDINGS: There is no evidence of fracture, dislocation, or joint effusion. There is no evidence of arthropathy or other focal bone abnormality. Soft tissues are unremarkable. IMPRESSION: Negative. Electronically Signed   By: Jasmine PangKim  Fujinaga M.D.   On: 06/21/2017 18:58   Dg Shoulder Left  Result Date: 06/21/2017 CLINICAL DATA:  Shoulder pain EXAM: LEFT SHOULDER - 2+ VIEW COMPARISON:  None. FINDINGS: There is no evidence of fracture or dislocation. There is no evidence of arthropathy or other focal bone abnormality. Soft tissues are unremarkable. IMPRESSION: Negative. Electronically Signed   By: Jasmine PangKim  Fujinaga M.D.   On: 06/21/2017 18:58        Scheduled Meds: . enoxaparin (LOVENOX) injection  40 mg Subcutaneous Q24H   Continuous Infusions: . sodium chloride 200 mL/hr at 06/23/17 1016  . sodium chloride 1,000 mL (06/23/17 1017)     LOS: 2 days    Time spent: 35min    [facility] Page via [email or web address], password [email or web address] After 7PM please contact night-coverage  06/23/2017, 11:05 AM

## 2017-06-24 LAB — BASIC METABOLIC PANEL
ANION GAP: 8 (ref 5–15)
BUN: 10 mg/dL (ref 6–20)
CHLORIDE: 105 mmol/L (ref 101–111)
CO2: 26 mmol/L (ref 22–32)
Calcium: 8.7 mg/dL — ABNORMAL LOW (ref 8.9–10.3)
Creatinine, Ser: 0.78 mg/dL (ref 0.61–1.24)
GFR calc non Af Amer: >60 mL/min (ref >60)
Glucose, Bld: 97 mg/dL (ref 65–99)
Potassium: 4.1 mmol/L (ref 3.5–5.1)
SODIUM: 139 mmol/L (ref 135–145)

## 2017-06-24 LAB — CK: Total CK: >50000 U/L — ABNORMAL HIGH (ref 49–397)

## 2017-06-24 MED ORDER — SODIUM CHLORIDE 0.9 % IV BOLUS
1000.0000 mL | Freq: Once | INTRAVENOUS | Status: AC
  Administered 2017-06-24: 1000 mL via INTRAVENOUS

## 2017-06-24 MED ORDER — SODIUM CHLORIDE 0.9 % IV SOLN
INTRAVENOUS | Status: DC
Start: 2017-06-24 — End: 2017-06-25
  Administered 2017-06-24 – 2017-06-25 (×7): via INTRAVENOUS

## 2017-06-24 NOTE — Progress Notes (Signed)
PROGRESS NOTE  Ricky Graham NWG:[id number] DOB: April 19, 1990 DOA: 06/21/2017 PCP: Patient, No Pcp Per   LOS: 3 days   Brief Narrative / Interim history: 27 year old male without medical problems, who was admitted to the hospital on 3/27 with muscle aches after heavy workout in the gym.  He was found to have rhabdomyolysis with a CK greater than 50,000 and was admitted to the hospital  Assessment & Plan: Principal Problem:   Rhabdomyolysis   Rhabdomyolysis -CK remains above measurable range, will continue IV fluids and increase her rate to 200, will bolus 1 L normal saline -Thankfully renal function has remained stable, continue to monitor   DVT prophylaxis: Lovenox Code Status: Full code Family Communication: no famaily at bedside Disposition Plan: home when CK improves  Consultants:   None   Procedures:   None   Antimicrobials:  None    Subjective: - no chest pain, shortness of breath, no abdominal pain, nausea or vomiting. Upset that he can't go home today   Objective: Vitals:   06/23/17 1317 06/23/17 2113 06/24/17 0500 06/24/17 0604  BP: 120/65 132/73  131/81  Pulse: (!) 56 (!) 57  (!) 54  Resp: (!) 22 18  18   Temp: 99.1 F (37.3 C) 97.9 F (36.6 C)  98.8 F (37.1 C)  TempSrc: Oral Oral  Oral  SpO2: 100% 100%  99%  Weight:   81.9 kg (180 lb 8.9 oz)   Height:        Intake/Output Summary (Last 24 hours) at 06/24/2017 1118 Last data filed at 06/24/2017 0500 Gross per 24 hour  Intake 2701.67 ml  Output -  Net 2701.67 ml   Filed Weights   06/22/17 1014 06/23/17 0500 06/24/17 0500  Weight: 82.5 kg (181 lb 13 oz) 85.9 kg (189 lb 6 oz) 81.9 kg (180 lb 8.9 oz)    Examination:  Constitutional: NAD Respiratory: CTA Cardiovascular: RRR    Data Reviewed: I have independently reviewed following labs and imaging studies   CBC: Recent Labs  Lab 06/21/17 1748 06/22/17 0746  WBC 6.7 7.5  NEUTROABS 4.3  --   HGB 16.9 13.7  HCT 49.8 41.2  MCV 85.9  86.4  PLT 251 224   Basic Metabolic Panel: Recent Labs  Lab 06/21/17 1748 06/22/17 0746 06/23/17 0550 06/24/17 0453  NA 137 139 138 139  K 4.5 4.2 4.1 4.1  CL 101 106 106 105  CO2 24 27 25 26   GLUCOSE 93 107* 97 97  BUN 10 10 11 10   CREATININE 0.76 0.84 0.87 0.78  CALCIUM 9.3 8.3* 8.6* 8.7*   GFR: Estimated Creatinine Clearance: 156.3 mL/min (by C-G formula based on SCr of 0.78 mg/dL). Liver Function Tests: Recent Labs  Lab 06/22/17 0746  AST 795*  ALT 281*  ALKPHOS 87  BILITOT 0.6  PROT 5.7*  ALBUMIN 3.2*   No results for input(s): LIPASE, AMYLASE in the last 168 hours. No results for input(s): AMMONIA in the last 168 hours. Coagulation Profile: No results for input(s): INR, PROTIME in the last 168 hours. Cardiac Enzymes: Recent Labs  Lab 06/21/17 1748 06/23/17 0550 06/24/17 0453  CKTOTAL >50,000* >50,000* >50,000*   BNP (last 3 results) No results for input(s): PROBNP in the last 8760 hours. HbA1C: No results for input(s): HGBA1C in the last 72 hours. CBG: No results for input(s): GLUCAP in the last 168 hours. Lipid Profile: No results for input(s): CHOL, HDL, LDLCALC, TRIG, CHOLHDL, LDLDIRECT in the last 72 hours. Thyroid Function Tests: Recent  Labs    06/22/17 0746  TSH 2.027   Anemia Panel: No results for input(s): VITAMINB12, FOLATE, FERRITIN, TIBC, IRON, RETICCTPCT in the last 72 hours. Urine analysis:    Component Value Date/Time   COLORURINE YELLOW 06/21/2017 2009   APPEARANCEUR CLEAR 06/21/2017 2009   LABSPEC 1.020 06/21/2017 2009   PHURINE 6.0 06/21/2017 2009   GLUCOSEU NEGATIVE 06/21/2017 2009   HGBUR LARGE (A) 06/21/2017 2009   BILIRUBINUR NEGATIVE 06/21/2017 2009   KETONESUR NEGATIVE 06/21/2017 2009   PROTEINUR 100 (A) 06/21/2017 2009   NITRITE NEGATIVE 06/21/2017 2009   LEUKOCYTESUR NEGATIVE 06/21/2017 2009   Sepsis Labs: Invalid input(s): PROCALCITONIN, LACTICIDVEN  No results found for this or any previous visit (from the  past 240 hour(s)).    Radiology Studies: No results found.   Scheduled Meds: . enoxaparin (LOVENOX) injection  40 mg Subcutaneous Q24H   Continuous Infusions: . sodium chloride 200 mL/hr at 06/24/17 0758    Pamella Pertostin Gherghe, MD, PhD [facility] Pager [phone]  If 7PM-7AM, please contact night-coverage [email or web address] Password [email or web address] 06/24/2017, 11:18 AM

## 2017-06-25 LAB — BASIC METABOLIC PANEL
Anion gap: 8 (ref 5–15)
BUN: 9 mg/dL (ref 6–20)
CHLORIDE: 107 mmol/L (ref 101–111)
CO2: 24 mmol/L (ref 22–32)
CREATININE: 0.83 mg/dL (ref 0.61–1.24)
Calcium: 8.6 mg/dL — ABNORMAL LOW (ref 8.9–10.3)
GFR calc Af Amer: >60 mL/min (ref >60)
GFR calc non Af Amer: >60 mL/min (ref >60)
GLUCOSE: 96 mg/dL (ref 65–99)
Potassium: 4.1 mmol/L (ref 3.5–5.1)
SODIUM: 139 mmol/L (ref 135–145)

## 2017-06-25 LAB — CBC
HEMATOCRIT: 40.9 % (ref 39.0–52.0)
Hemoglobin: 13.5 g/dL (ref 13.0–17.0)
MCH: 28.8 pg (ref 26.0–34.0)
MCHC: 33 g/dL (ref 30.0–36.0)
MCV: 87.2 fL (ref 78.0–100.0)
PLATELETS: 237 10*3/uL (ref 150–400)
RBC: 4.69 MIL/uL (ref 4.22–5.81)
RDW: 13.6 % (ref 11.5–15.5)
WBC: 6.6 10*3/uL (ref 4.0–10.5)

## 2017-06-25 LAB — CK: CK TOTAL: 37451 U/L — AB (ref 49–397)

## 2017-06-25 MED ORDER — SODIUM CHLORIDE 0.9 % IV BOLUS
1000.0000 mL | Freq: Once | INTRAVENOUS | Status: AC
  Administered 2017-06-25: 1000 mL via INTRAVENOUS

## 2017-06-25 MED ORDER — SODIUM CHLORIDE 0.9 % IV SOLN
INTRAVENOUS | Status: DC
  Administered 2017-06-25 – 2017-06-26 (×6): via INTRAVENOUS

## 2017-06-25 NOTE — Progress Notes (Signed)
PROGRESS NOTE  Ricky Graham:[id number] DOB: Nov 04, 1990 DOA: 06/21/2017 PCP: Patient, No Pcp Per   LOS: 4 days   Brief Narrative / Interim history: 27 year old male without medical problems, who was admitted to the hospital on 3/27 with muscle aches after heavy workout in the gym.  He was found to have rhabdomyolysis with a CK greater than 50,000 and was admitted to the hospital  Assessment & Plan: Principal Problem:   Rhabdomyolysis   Rhabdomyolysis -Related to strenuous exercise.  -CK 50,000----36,000 -TSH 2.0 HIV negative. UDS negative.  -Continue with IV fluids 200 -will give 2 L IV bolus.     DVT prophylaxis: Lovenox Code Status: Full code Family Communication: no famaily at bedside Disposition Plan: home when CK improves  Consultants:   None   Procedures:   None   Antimicrobials:  None    Subjective: He is feeling well, no dyspnea. No muscle pain  Objective: Vitals:   06/24/17 0604 06/24/17 1511 06/24/17 2142 06/25/17 0520  BP: 131/81 129/73 (!) 133/99 126/82  Pulse: (!) 54 60 61 (!) 52  Resp: 18 20 18 12   Temp: 98.8 F (37.1 C) 98.2 F (36.8 C) 99.3 F (37.4 C) 98.2 F (36.8 C)  TempSrc: Oral Oral Oral Oral  SpO2: 99% 100% 97% 100%  Weight:    81.9 kg (180 lb 8 oz)  Height:        Intake/Output Summary (Last 24 hours) at 06/25/2017 1426 Last data filed at 06/24/2017 2309 Gross per 24 hour  Intake 1851.67 ml  Output 350 ml  Net 1501.67 ml   Filed Weights   06/23/17 0500 06/24/17 0500 06/25/17 0520  Weight: 85.9 kg (189 lb 6 oz) 81.9 kg (180 lb 8.9 oz) 81.9 kg (180 lb 8 oz)    Examination:  Constitutional: NAD Respiratory: CTA Cardiovascular: RRR Abdomen; soft, nt    Data Reviewed: I have independently reviewed following labs and imaging studies   CBC: Recent Labs  Lab 06/21/17 1748 06/22/17 0746 06/25/17 0349  WBC 6.7 7.5 6.6  NEUTROABS 4.3  --   --   HGB 16.9 13.7 13.5  HCT 49.8 41.2 40.9  MCV 85.9 86.4 87.2  PLT  251 224 237   Basic Metabolic Panel: Recent Labs  Lab 06/21/17 1748 06/22/17 0746 06/23/17 0550 06/24/17 0453 06/25/17 0349  NA 137 139 138 139 139  K 4.5 4.2 4.1 4.1 4.1  CL 101 106 106 105 107  CO2 24 27 25 26 24   GLUCOSE 93 107* 97 97 96  BUN 10 10 11 10 9   CREATININE 0.76 0.84 0.87 0.78 0.83  CALCIUM 9.3 8.3* 8.6* 8.7* 8.6*   GFR: Estimated Creatinine Clearance: 150.6 mL/min (by C-G formula based on SCr of 0.83 mg/dL). Liver Function Tests: Recent Labs  Lab 06/22/17 0746  AST 795*  ALT 281*  ALKPHOS 87  BILITOT 0.6  PROT 5.7*  ALBUMIN 3.2*   No results for input(s): LIPASE, AMYLASE in the last 168 hours. No results for input(s): AMMONIA in the last 168 hours. Coagulation Profile: No results for input(s): INR, PROTIME in the last 168 hours. Cardiac Enzymes: Recent Labs  Lab 06/21/17 1748 06/23/17 0550 06/24/17 0453 06/25/17 0349  CKTOTAL >50,000* >50,000* >50,000* 37,451*   BNP (last 3 results) No results for input(s): PROBNP in the last 8760 hours. HbA1C: No results for input(s): HGBA1C in the last 72 hours. CBG: No results for input(s): GLUCAP in the last 168 hours. Lipid Profile: No results for input(s): CHOL,  HDL, LDLCALC, TRIG, CHOLHDL, LDLDIRECT in the last 72 hours. Thyroid Function Tests: No results for input(s): TSH, T4TOTAL, FREET4, T3FREE, THYROIDAB in the last 72 hours. Anemia Panel: No results for input(s): VITAMINB12, FOLATE, FERRITIN, TIBC, IRON, RETICCTPCT in the last 72 hours. Urine analysis:    Component Value Date/Time   COLORURINE YELLOW 06/21/2017 2009   APPEARANCEUR CLEAR 06/21/2017 2009   LABSPEC 1.020 06/21/2017 2009   PHURINE 6.0 06/21/2017 2009   GLUCOSEU NEGATIVE 06/21/2017 2009   HGBUR LARGE (A) 06/21/2017 2009   BILIRUBINUR NEGATIVE 06/21/2017 2009   KETONESUR NEGATIVE 06/21/2017 2009   PROTEINUR 100 (A) 06/21/2017 2009   NITRITE NEGATIVE 06/21/2017 2009   LEUKOCYTESUR NEGATIVE 06/21/2017 2009   Sepsis  Labs: Invalid input(s): PROCALCITONIN, LACTICIDVEN  No results found for this or any previous visit (from the past 240 hour(s)).    Radiology Studies: No results found.   Scheduled Meds: . enoxaparin (LOVENOX) injection  40 mg Subcutaneous Q24H   Continuous Infusions: . sodium chloride 200 mL/hr at 06/25/17 1053    Hartley BarefootBelkys Najah Liverman, MD [facility] Pager [phone]  If 7PM-7AM, please contact night-coverage [email or web address] Password [email or web address] 06/25/2017, 2:26 PM

## 2017-06-26 LAB — BASIC METABOLIC PANEL
ANION GAP: 6 (ref 5–15)
BUN: 7 mg/dL (ref 6–20)
CALCIUM: 8.7 mg/dL — AB (ref 8.9–10.3)
CO2: 27 mmol/L (ref 22–32)
CREATININE: 0.69 mg/dL (ref 0.61–1.24)
Chloride: 105 mmol/L (ref 101–111)
Glucose, Bld: 89 mg/dL (ref 65–99)
Potassium: 3.9 mmol/L (ref 3.5–5.1)
SODIUM: 138 mmol/L (ref 135–145)

## 2017-06-26 LAB — PHOSPHORUS: PHOSPHORUS: 4.2 mg/dL (ref 2.5–4.6)

## 2017-06-26 LAB — CK: CK TOTAL: 18247 U/L — AB (ref 49–397)

## 2017-06-26 MED ORDER — SODIUM CHLORIDE 0.9 % IV BOLUS
1000.0000 mL | Freq: Once | INTRAVENOUS | Status: AC
  Administered 2017-06-26: 1000 mL via INTRAVENOUS

## 2017-06-26 NOTE — Progress Notes (Signed)
PROGRESS NOTE  FITZPATRICK ALBERICO ZOX:[id number] DOB: 29-Apr-1990 DOA: 06/21/2017 PCP: Patient, No Pcp Per   LOS: 5 days   Brief Narrative / Interim history: 27 year old male without medical problems, who was admitted to the hospital on 3/27 with muscle aches after heavy workout in the gym.  He was found to have rhabdomyolysis with a CK greater than 50,000 and was admitted to the hospital  Assessment & Plan: Principal Problem:   Rhabdomyolysis   Rhabdomyolysis -Related to strenuous exercise.  -CK 50,000----36,000---18,000 -TSH 2.0 HIV negative. UDS negative.  -Continue with IV fluids 200 -repeat IV bolus.     DVT prophylaxis: Lovenox Code Status: Full code Family Communication: no famaily at bedside Disposition Plan: home when CK improves  Consultants:   None   Procedures:   None   Antimicrobials:  None    Subjective: He is feeling well, denies dyspnea. No muscle pain. Wants to go home   Objective: Vitals:   06/25/17 0520 06/25/17 1519 06/25/17 2148 06/26/17 0500  BP: 126/82 (!) 142/69 137/82 138/65  Pulse: (!) 52 (!) 50 (!) 49 (!) 54  Resp: 12 17 18 18   Temp: 98.2 F (36.8 C) 98 F (36.7 C) 98 F (36.7 C) 97.9 F (36.6 C)  TempSrc: Oral Oral Oral Oral  SpO2: 100% 100% 100% 100%  Weight: 81.9 kg (180 lb 8 oz)   83 kg (183 lb)  Height:        Intake/Output Summary (Last 24 hours) at 06/26/2017 1619 Last data filed at 06/26/2017 0330 Gross per 24 hour  Intake 3249.99 ml  Output -  Net 3249.99 ml   Filed Weights   06/24/17 0500 06/25/17 0520 06/26/17 0500  Weight: 81.9 kg (180 lb 8.9 oz) 81.9 kg (180 lb 8 oz) 83 kg (183 lb)    Examination:  Constitutional: NAD Respiratory: CTA Cardiovascular: S 1, S 2 RRR Abdomen; BS present, soft, nt    Data Reviewed: I have independently reviewed following labs and imaging studies   CBC: Recent Labs  Lab 06/21/17 1748 06/22/17 0746 06/25/17 0349  WBC 6.7 7.5 6.6  NEUTROABS 4.3  --   --   HGB 16.9 13.7  13.5  HCT 49.8 41.2 40.9  MCV 85.9 86.4 87.2  PLT 251 224 237   Basic Metabolic Panel: Recent Labs  Lab 06/22/17 0746 06/23/17 0550 06/24/17 0453 06/25/17 0349 06/26/17 0622  NA 139 138 139 139 138  K 4.2 4.1 4.1 4.1 3.9  CL 106 106 105 107 105  CO2 27 25 26 24 27   GLUCOSE 107* 97 97 96 89  BUN 10 11 10 9 7   CREATININE 0.84 0.87 0.78 0.83 0.69  CALCIUM 8.3* 8.6* 8.7* 8.6* 8.7*  PHOS  --   --   --   --  4.2   GFR: Estimated Creatinine Clearance: 156.3 mL/min (by C-G formula based on SCr of 0.69 mg/dL). Liver Function Tests: Recent Labs  Lab 06/22/17 0746  AST 795*  ALT 281*  ALKPHOS 87  BILITOT 0.6  PROT 5.7*  ALBUMIN 3.2*   No results for input(s): LIPASE, AMYLASE in the last 168 hours. No results for input(s): AMMONIA in the last 168 hours. Coagulation Profile: No results for input(s): INR, PROTIME in the last 168 hours. Cardiac Enzymes: Recent Labs  Lab 06/21/17 1748 06/23/17 0550 06/24/17 0453 06/25/17 0349 06/26/17 0622  CKTOTAL >50,000* >50,000* >50,000* 37,451* 18,247*   BNP (last 3 results) No results for input(s): PROBNP in the last 8760 hours. HbA1C:  No results for input(s): HGBA1C in the last 72 hours. CBG: No results for input(s): GLUCAP in the last 168 hours. Lipid Profile: No results for input(s): CHOL, HDL, LDLCALC, TRIG, CHOLHDL, LDLDIRECT in the last 72 hours. Thyroid Function Tests: No results for input(s): TSH, T4TOTAL, FREET4, T3FREE, THYROIDAB in the last 72 hours. Anemia Panel: No results for input(s): VITAMINB12, FOLATE, FERRITIN, TIBC, IRON, RETICCTPCT in the last 72 hours. Urine analysis:    Component Value Date/Time   COLORURINE YELLOW 06/21/2017 2009   APPEARANCEUR CLEAR 06/21/2017 2009   LABSPEC 1.020 06/21/2017 2009   PHURINE 6.0 06/21/2017 2009   GLUCOSEU NEGATIVE 06/21/2017 2009   HGBUR LARGE (A) 06/21/2017 2009   BILIRUBINUR NEGATIVE 06/21/2017 2009   KETONESUR NEGATIVE 06/21/2017 2009   PROTEINUR 100 (A)  06/21/2017 2009   NITRITE NEGATIVE 06/21/2017 2009   LEUKOCYTESUR NEGATIVE 06/21/2017 2009   Sepsis Labs: Invalid input(s): PROCALCITONIN, LACTICIDVEN  No results found for this or any previous visit (from the past 240 hour(s)).    Radiology Studies: No results found.   Scheduled Meds: . enoxaparin (LOVENOX) injection  40 mg Subcutaneous Q24H   Continuous Infusions: . sodium chloride 200 mL/hr at 06/26/17 1453  . sodium chloride 1,000 mL (06/26/17 1611)    Hartley BarefootBelkys Soo Steelman, MD [facility] Pager [phone]  If 7PM-7AM, please contact night-coverage [email or web address] Password [email or web address] 06/26/2017, 4:19 PM

## 2017-06-27 DIAGNOSIS — M6282 Rhabdomyolysis: Principal | ICD-10-CM

## 2017-06-27 LAB — BASIC METABOLIC PANEL
Anion gap: 10 (ref 5–15)
BUN: 12 mg/dL (ref 6–20)
CALCIUM: 8.7 mg/dL — AB (ref 8.9–10.3)
CHLORIDE: 105 mmol/L (ref 101–111)
CO2: 24 mmol/L (ref 22–32)
CREATININE: 0.84 mg/dL (ref 0.61–1.24)
Glucose, Bld: 100 mg/dL — ABNORMAL HIGH (ref 65–99)
Potassium: 4 mmol/L (ref 3.5–5.1)
SODIUM: 139 mmol/L (ref 135–145)

## 2017-06-27 LAB — CK: CK TOTAL: 7178 U/L — AB (ref 49–397)

## 2017-06-27 MED ORDER — SODIUM CHLORIDE 0.9 % IV BOLUS
1000.0000 mL | Freq: Once | INTRAVENOUS | Status: AC
  Administered 2017-06-27: 1000 mL via INTRAVENOUS

## 2017-06-27 NOTE — Plan of Care (Signed)
  Problem: Clinical Measurements: Goal: Ability to maintain clinical measurements within normal limits will improve Outcome: Progressing Goal: Will remain free from infection Outcome: Progressing Goal: Diagnostic test results will improve Outcome: Progressing   Problem: Elimination: Goal: Will not experience complications related to bowel motility Outcome: Progressing   

## 2017-06-27 NOTE — Discharge Summary (Addendum)
Physician Discharge Summary  Derrill CenterKalyn D Pasqual ZOX:[id number] DOB: 03-Aug-1990 DOA: 06/21/2017  PCP: Patient, No Pcp Per  Admit date: 06/21/2017 Discharge date: 06/27/2017  Admitted From: Home  Disposition: Home   Recommendations for Outpatient Follow-up:  1. Follow up with PCP in 1-2 weeks 2. Please obtain BMP/CBC in one week 3. Needs repeat CK level./ And LFT     Discharge Condition: stable.  CODE STATUS: full code.  Diet recommendation: Heart Healthy  Brief/Interim Summary: Brief Narrative / Interim history: 27 year old male without medical problems, who was admitted to the hospital on 3/27 with muscle aches after heavy workout in the gym. He was found to have rhabdomyolysis with a CK greater than 50,000 and was admitted to the hospital  Assessment & Plan: Principal Problem:   Rhabdomyolysis   Rhabdomyolysis -Related to strenuous exercise.  -CK 50,000----36,000---18,000---7,000 -TSH 2.0 HIV negative. UDS negative.  -he was treated with IV fluids at 200 cc per hour and daily IV bolus 2 L.  His ck has decreased, stable for discharge. Advised to drink plenty of water.  He should abstain from exercise until follow up With PCP      Discharge Diagnoses:  Principal Problem:   Rhabdomyolysis    Discharge Instructions  Discharge Instructions    Diet - low sodium heart healthy   Complete by:  As directed    Increase activity slowly   Complete by:  As directed      Allergies as of 06/27/2017   No Known Allergies     Medication List    You have not been prescribed any medications.     No Known Allergies  Consultations: none  Procedures/Studies: Dg Elbow Complete Left  Result Date: 06/21/2017 CLINICAL DATA:  Elbow pain EXAM: LEFT ELBOW - COMPLETE 3+ VIEW COMPARISON:  None. FINDINGS: There is no evidence of fracture, dislocation, or joint effusion. There is no evidence of arthropathy or other focal bone abnormality. Soft tissues are unremarkable. IMPRESSION:  Negative. Electronically Signed   By: Jasmine PangKim  Fujinaga M.D.   On: 06/21/2017 18:58   Dg Shoulder Left  Result Date: 06/21/2017 CLINICAL DATA:  Shoulder pain EXAM: LEFT SHOULDER - 2+ VIEW COMPARISON:  None. FINDINGS: There is no evidence of fracture or dislocation. There is no evidence of arthropathy or other focal bone abnormality. Soft tissues are unremarkable. IMPRESSION: Negative. Electronically Signed   By: Jasmine PangKim  Fujinaga M.D.   On: 06/21/2017 18:58  )    Subjective: He is feeling better, muscle pain resolved  Discharge Exam: Vitals:   06/26/17 0500 06/27/17 0444  BP: 138/65 (!) 141/88  Pulse: (!) 54 (!) 51  Resp: 18 16  Temp: 97.9 F (36.6 C) 97.6 F (36.4 C)  SpO2: 100% 100%   Vitals:   06/25/17 2148 06/26/17 0500 06/27/17 0444 06/27/17 0452  BP: 137/82 138/65 (!) 141/88   Pulse: (!) 49 (!) 54 (!) 51   Resp: 18 18 16    Temp: 98 F (36.7 C) 97.9 F (36.6 C) 97.6 F (36.4 C)   TempSrc: Oral Oral Oral   SpO2: 100% 100% 100%   Weight:  83 kg (183 lb)  82.8 kg (182 lb 8.7 oz)  Height:        General: Pt is alert, awake, not in acute distress Cardiovascular: RRR, S1/S2 +, no rubs, no gallops Respiratory: CTA bilaterally, no wheezing, no rhonchi Abdominal: Soft, NT, ND, bowel sounds + Extremities: no edema, no cyanosis    The results of significant diagnostics from this hospitalization (including  imaging, microbiology, ancillary and laboratory) are listed below for reference.     Microbiology: No results found for this or any previous visit (from the past 240 hour(s)).   Labs: BNP (last 3 results) No results for input(s): BNP in the last 8760 hours. Basic Metabolic Panel: Recent Labs  Lab 06/23/17 0550 06/24/17 0453 06/25/17 0349 06/26/17 0622 06/27/17 0620  NA 138 139 139 138 139  K 4.1 4.1 4.1 3.9 4.0  CL 106 105 107 105 105  CO2 25 26 24 27 24   GLUCOSE 97 97 96 89 100*  BUN 11 10 9 7 12   CREATININE 0.87 0.78 0.83 0.69 0.84  CALCIUM 8.6* 8.7* 8.6*  8.7* 8.7*  PHOS  --   --   --  4.2  --    Liver Function Tests: Recent Labs  Lab 06/22/17 0746  AST 795*  ALT 281*  ALKPHOS 87  BILITOT 0.6  PROT 5.7*  ALBUMIN 3.2*   No results for input(s): LIPASE, AMYLASE in the last 168 hours. No results for input(s): AMMONIA in the last 168 hours. CBC: Recent Labs  Lab 06/21/17 1748 06/22/17 0746 06/25/17 0349  WBC 6.7 7.5 6.6  NEUTROABS 4.3  --   --   HGB 16.9 13.7 13.5  HCT 49.8 41.2 40.9  MCV 85.9 86.4 87.2  PLT 251 224 237   Cardiac Enzymes: Recent Labs  Lab 06/23/17 0550 06/24/17 0453 06/25/17 0349 06/26/17 0622 06/27/17 0620  CKTOTAL >50,000* >50,000* 37,451* 18,247* 7,178*   BNP: Invalid input(s): POCBNP CBG: No results for input(s): GLUCAP in the last 168 hours. D-Dimer No results for input(s): DDIMER in the last 72 hours. Hgb A1c No results for input(s): HGBA1C in the last 72 hours. Lipid Profile No results for input(s): CHOL, HDL, LDLCALC, TRIG, CHOLHDL, LDLDIRECT in the last 72 hours. Thyroid function studies No results for input(s): TSH, T4TOTAL, T3FREE, THYROIDAB in the last 72 hours.  Invalid input(s): FREET3 Anemia work up No results for input(s): VITAMINB12, FOLATE, FERRITIN, TIBC, IRON, RETICCTPCT in the last 72 hours. Urinalysis    Component Value Date/Time   COLORURINE YELLOW 06/21/2017 2009   APPEARANCEUR CLEAR 06/21/2017 2009   LABSPEC 1.020 06/21/2017 2009   PHURINE 6.0 06/21/2017 2009   GLUCOSEU NEGATIVE 06/21/2017 2009   HGBUR LARGE (A) 06/21/2017 2009   BILIRUBINUR NEGATIVE 06/21/2017 2009   KETONESUR NEGATIVE 06/21/2017 2009   PROTEINUR 100 (A) 06/21/2017 2009   NITRITE NEGATIVE 06/21/2017 2009   LEUKOCYTESUR NEGATIVE 06/21/2017 2009   Sepsis Labs Invalid input(s): PROCALCITONIN,  WBC,  LACTICIDVEN Microbiology No results found for this or any previous visit (from the past 240 hour(s)).   Time coordinating discharge: Over 30 minutes  SIGNED:   Alba Cory, MD  [facility] 06/27/2017, 10:10 AM Pager   If 7PM-7AM, please contact night-coverage [email or web address] Password [email or web address]

## 2017-06-27 NOTE — Care Management Note (Signed)
Case Management Note  Patient Details  Name: Ricky Graham MRN: [id number] Date of Birth: 03/02/1991  Subjective/Objective:          Rhabdomyolysis          Action/Plan: Transition to home today. Pt  [facility] to schedule hospital follow up appointment for 4/12 with Dr. Danelle EarthlyNoel. NCM made pt aware and provided pt with [facility] information.  Expected Discharge Date:  06/27/17               Expected Discharge Plan:  Home/Self Care  In-House Referral:     Discharge planning Services  CM Consult  Post Acute Care Choice:   N/A Choice offered to:   N/A  DME Arranged:   N/A DME Agency:   N/A  HH Arranged:   N/A HH Agency:   N/A  Status of Service:  Completed, signed off  If discussed at Long Length of Stay Meetings, dates discussed:    Additional Comments:  Epifanio LeschesCole, Spyridon Hornstein Hudson, RN 06/27/2017, 10:45 AM

## 2017-07-12 ENCOUNTER — Emergency Department (HOSPITAL_COMMUNITY)
Admission: EM | Admit: 2017-07-12 | Discharge: 2017-07-12 | Disposition: A | Discharge status: Home / Self Care | Payer: Medicaid Other | Attending: Emergency Medicine | Admitting: Emergency Medicine

## 2017-07-12 ENCOUNTER — Other Ambulatory Visit: Payer: Self-pay

## 2017-07-12 ENCOUNTER — Encounter (HOSPITAL_COMMUNITY): Payer: Self-pay

## 2017-07-12 DIAGNOSIS — H10022 Other mucopurulent conjunctivitis, left eye: Secondary | ICD-10-CM | POA: Insufficient documentation

## 2017-07-12 DIAGNOSIS — Y9289 Other specified places as the place of occurrence of the external cause: Secondary | ICD-10-CM | POA: Insufficient documentation

## 2017-07-12 DIAGNOSIS — H1032 Unspecified acute conjunctivitis, left eye: Secondary | ICD-10-CM

## 2017-07-12 DIAGNOSIS — X58XXXA Exposure to other specified factors, initial encounter: Secondary | ICD-10-CM | POA: Insufficient documentation

## 2017-07-12 DIAGNOSIS — F17200 Nicotine dependence, unspecified, uncomplicated: Secondary | ICD-10-CM | POA: Insufficient documentation

## 2017-07-12 DIAGNOSIS — Y9389 Activity, other specified: Secondary | ICD-10-CM | POA: Insufficient documentation

## 2017-07-12 DIAGNOSIS — Y999 Unspecified external cause status: Secondary | ICD-10-CM | POA: Insufficient documentation

## 2017-07-12 DIAGNOSIS — S0502XA Injury of conjunctiva and corneal abrasion without foreign body, left eye, initial encounter: Secondary | ICD-10-CM

## 2017-07-12 MED ORDER — TETRACAINE HCL 0.5 % OP SOLN
1.0000 [drp] | Freq: Once | OPHTHALMIC | Status: AC
  Administered 2017-07-12: 1 [drp] via OPHTHALMIC
  Filled 2017-07-12: qty 4

## 2017-07-12 MED ORDER — CIPROFLOXACIN HCL 0.3 % OP SOLN
1.0000 [drp] | Freq: Once | OPHTHALMIC | Status: AC
  Administered 2017-07-12: 1 [drp] via OPHTHALMIC
  Filled 2017-07-12: qty 2.5

## 2017-07-12 MED ORDER — CIPROFLOXACIN HCL 0.3 % OP SOLN: 1.0000 [drp] | OPHTHALMIC | 0 refills | Status: DC

## 2017-07-12 MED ORDER — CIPROFLOXACIN HCL 0.3 % OP SOLN: 1.0000 [drp] | OPHTHALMIC | 0 refills | Status: AC

## 2017-07-12 MED ORDER — FLUORESCEIN SODIUM 1 MG OP STRP
1.0000 | ORAL_STRIP | Freq: Once | OPHTHALMIC | Status: AC
  Administered 2017-07-12: 1 via OPHTHALMIC
  Filled 2017-07-12: qty 1

## 2017-07-12 NOTE — ED Triage Notes (Signed)
PT reports left eye red, crusty, and gritty feeling since yesterday.  Denies blurred vision

## 2017-07-12 NOTE — ED Provider Notes (Signed)
[facility] EMERGENCY DEPARTMENT Provider Note   CSN: [id number] Arrival date & time: 07/12/17  1444     History   Chief Complaint Chief Complaint  Patient presents with  . Conjunctivitis    HPI Ricky Graham is a 27 y.o. male presenting with left eye redness, pruritus, discharge which started upon awakening 2 days ago.  States that initially the redness was only in the corner of his eyes but eventually spread to the entire white of his eye.  He woke up this morning with crusting around the left eye lid and pruritis.  Has tried over-the-counter pinkeye without relief.  He states that he has been rubbing his eyes and has had a sensation of foreign body in this left upper lid yesterday. He denies any visual changes, headache, nausea, vomiting, fever chills or other symptoms.  He does not wear contact lenses.   HPI  History reviewed. No pertinent past medical history.  Patient Active Problem List   Diagnosis Date Noted  . Rhabdomyolysis 06/21/2017    History reviewed. No pertinent surgical history.      Home Medications    Prior to Admission medications   Medication Sig Start Date End Date Taking? Authorizing Provider  ciprofloxacin (CILOXAN) 0.3 % ophthalmic solution Place 1 drop into both eyes every 2 (two) hours while awake for 7 days. Administer 1 drop, every 2 hours, while awake, for 2 days. Then 1 drop, every 4 hours, while awake, for the next 5 days. 07/12/17 07/19/17  Ricky Graham    Family History Family History  Problem Relation Age of Onset  . Hypertension Mother     Social History Social History   Tobacco Use  . Smoking status: Current Every Day Smoker  . Smokeless tobacco: Never Used  Substance Use Topics  . Alcohol use: Yes  . Drug use: No     Allergies   Patient has no known allergies.   Review of Systems Review of Systems  Constitutional: Negative for chills, diaphoresis, fatigue and fever.  HENT: Negative  for congestion, ear pain, rhinorrhea, sinus pressure, sinus pain and sore throat.   Eyes: Positive for discharge, redness and itching. Negative for photophobia, pain and visual disturbance.  Respiratory: Negative for cough, shortness of breath and stridor.   Cardiovascular: Negative for chest pain and palpitations.  Gastrointestinal: Negative for abdominal pain, nausea and vomiting.  Musculoskeletal: Negative for myalgias, neck pain and neck stiffness.  Skin: Negative for color change, pallor and rash.  Neurological: Negative for dizziness, light-headedness and headaches.     Physical Exam Updated Vital Signs BP 127/80   Pulse 73   Temp 98.9 F (37.2 C)   Resp 17   SpO2 96%   Physical Exam  Constitutional: He appears well-developed and well-nourished. No distress.  HENT:  Head: Normocephalic and atraumatic.  Eyes: Pupils are equal, round, and reactive to light. Conjunctivae and EOM are normal. Right eye exhibits no discharge. Left eye exhibits discharge. No scleral icterus.  Left sclera and conjunctiva injected, no foreign body on eyelid eversion.  No periorbital edema, warmth or erythema. No pain with EOM. Left eye discharge. Lids, lashes, lacrimals without lesions. Conjunctiva and sclera white and non-injected on the right. Cornea with fluorescein uptake at 3 o'clock. Anterior chamber is deep and normal appearing. Irises are round and reactive. Lenses are clear.  Neck: Normal range of motion. Neck supple.  Cardiovascular: Normal rate.  Pulmonary/Chest: Effort normal. No respiratory distress.  Musculoskeletal: He exhibits  no edema.  Neurological: He is alert.  Skin: Skin is warm and dry. No rash noted. He is not diaphoretic. No erythema. No pallor.  Psychiatric: He has a normal mood and affect.  Nursing note and vitals reviewed.    ED Treatments / Results  Labs (all labs ordered are listed, but only abnormal results are displayed) Labs Reviewed - No data to  display  EKG None  Radiology No results found.  Procedures Procedures (including critical care time)  Medications Ordered in ED Medications  fluorescein ophthalmic strip 1 strip (1 strip Left Eye Given 07/12/17 1601)  tetracaine (PONTOCAINE) 0.5 % ophthalmic solution 1 drop (1 drop Left Eye Given 07/12/17 1601)  ciprofloxacin (CILOXAN) 0.3 % ophthalmic solution 1 drop (1 drop Left Eye Given 07/12/17 1735)     Initial Impression / Assessment and Plan / ED Course  I have reviewed the triage vital signs and the nursing notes.  Pertinent labs & imaging results that were available during my care of the patient were reviewed by me and considered in my medical decision making (see chart for details).    Patient presents with left eye injection, discharge and FB sensation. No visual changes, headache or other symptoms. He is otherwise well-appearing, nontoxic and afebrile.  Fluorescein uptake on exam. I suspect abrasion as a result of excessive eye rubbing from previous conjunctivitis symptoms. Will dc home with abx drops and close follow up with ophthalmology.  Discussed return precautions and patient understood and agreed with plan.  Final Clinical Impressions(s) / ED Diagnoses   Final diagnoses:  Acute conjunctivitis of left eye, unspecified acute conjunctivitis type  Abrasion of left cornea, initial encounter    ED Discharge Orders        Ordered    ciprofloxacin (CILOXAN) 0.3 % ophthalmic solution  Every 2 hours while awake,   Status:  Discontinued     07/12/17 1718    ciprofloxacin (CILOXAN) 0.3 % ophthalmic solution  Every 2 hours while awake     07/12/17 1719       Ricky Graham 07/12/17 1746    Ricky Conn, MD 07/13/17 [phone]

## 2017-07-12 NOTE — Discharge Instructions (Addendum)
As discussed, apply eyedrops every 2 hours while awake for the first 2 days and every 4 hours while awake for the following 5 days. Wash your hands frequently and avoid touching your eyes.  Follow up with ophthalmology in clinic. Return if symptoms worsen or new concerning symptoms in the meantime.

## 2017-07-20 ENCOUNTER — Ambulatory Visit: Payer: Self-pay | Admitting: Physician Assistant

## 2017-07-23 ENCOUNTER — Emergency Department (HOSPITAL_COMMUNITY)
Admission: EM | Admit: 2017-07-23 | Discharge: 2017-07-23 | Disposition: A | Discharge status: Home / Self Care | Payer: Medicaid Other | Attending: Emergency Medicine | Admitting: Emergency Medicine

## 2017-07-23 DIAGNOSIS — F17200 Nicotine dependence, unspecified, uncomplicated: Secondary | ICD-10-CM | POA: Insufficient documentation

## 2017-07-23 DIAGNOSIS — Y33XXXD Other specified events, undetermined intent, subsequent encounter: Secondary | ICD-10-CM | POA: Insufficient documentation

## 2017-07-23 DIAGNOSIS — S0502XD Injury of conjunctiva and corneal abrasion without foreign body, left eye, subsequent encounter: Secondary | ICD-10-CM | POA: Insufficient documentation

## 2017-07-23 MED ORDER — TETRACAINE HCL 0.5 % OP SOLN
1.0000 [drp] | Freq: Once | OPHTHALMIC | Status: AC
  Administered 2017-07-23: 1 [drp] via OPHTHALMIC
  Filled 2017-07-23: qty 4

## 2017-07-23 MED ORDER — FLUORESCEIN SODIUM 1 MG OP STRP
1.0000 | ORAL_STRIP | Freq: Once | OPHTHALMIC | Status: AC
  Administered 2017-07-23: 1 via OPHTHALMIC
  Filled 2017-07-23: qty 1

## 2017-07-23 NOTE — ED Provider Notes (Signed)
[facility] EMERGENCY DEPARTMENT Provider Note   CSN: [id number] Arrival date & time: 07/23/17  1341     History   Chief Complaint Chief Complaint  Patient presents with  . Eye Problem    HPI Ricky Graham is a 27 y.o. male who presents to ED for evaluation of recheck of conjunctivitis and corneal abrasion that was diagnosed in his left eye 10 days ago.  He has been using his antibiotic eyedrops with improvement in his discharge or discomfort.  However, he is here because he did have slight foreign body sensation last night and he wants to make sure that he is cleared to return to work.  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  Or it could have been his rubbing of his eye due to his conjunctivitis.  Reports improvement in the purulent drainage that was present in his eyes last week.  He denies any vision changes, contact lens use, additional injuries, pain with eye movements, photophobia.  He does not have an eye doctor to follow-up with.  HPI  No past medical history on file.  Patient Active Problem List   Diagnosis Date Noted  . Rhabdomyolysis 06/21/2017    No past surgical history on file.      Home Medications    Prior to Admission medications   Not on File    Family History Family History  Problem Relation Age of Onset  . Hypertension Mother     Social History Social History   Tobacco Use  . Smoking status: Current Every Day Smoker  . Smokeless tobacco: Never Used  Substance Use Topics  . Alcohol use: Yes  . Drug use: No     Allergies   Patient has no known allergies.   Review of Systems Review of Systems  Constitutional: Negative for chills and fever.  HENT: Negative for drooling and rhinorrhea.   Eyes: Positive for discharge, redness and itching. Negative for photophobia, pain and visual disturbance.  Neurological: Negative for headaches.      Physical Exam Updated Vital Signs BP 138/81 (BP Location: Right Arm)   Pulse 81   Temp 98.6 F (37 C) (Oral)   Resp 18   SpO2 100%   Physical Exam  Constitutional: He appears well-developed and well-nourished. No distress.  Nontoxic-appearing and in no acute distress.  HENT:  Head: Normocephalic and atraumatic.  Right Ear: Tympanic membrane normal.  Left Ear: Tympanic membrane normal.  Nose: Nose normal.  Eyes: EOM are normal. No foreign body present in the left eye. Left conjunctiva is injected. No scleral icterus. Right eye exhibits normal extraocular motion. Left eye exhibits normal extraocular motion.  Left eye with slightly injected conjunctiva, no eyelid swelling or erythema or tenderness to palpation.  Mild clear tearful drainage noted.  No foreign bodies noted.  No pain with EOMs.  No chemosis, proptosis, or consensual photophobia. Fluorescein stain with no corneal abrasions, foreign bodies, dendritic lesions, ulcerations, negative Sidel sign.  Neck: Normal range of motion.  Pulmonary/Chest: Effort normal. No respiratory distress.  Neurological: He is alert.  Skin: No rash noted. He is not diaphoretic.  Psychiatric: He has a normal mood and affect.  Nursing note and vitals reviewed.    ED Treatments / Results  Labs (all labs ordered are listed, but only abnormal results are displayed) Labs Reviewed - No data to display  EKG None  Radiology No results  found.  Procedures Procedures (including critical care time)  Medications Ordered in ED Medications  fluorescein ophthalmic strip 1 strip (has no administration in time range)  tetracaine (PONTOCAINE) 0.5 % ophthalmic solution 1 drop (has no administration in time range)     Initial Impression / Assessment and Plan / ED Course  I have reviewed the triage vital signs and the nursing notes.  Pertinent labs & imaging results that were available during my care of the patient were reviewed by me and  considered in my medical decision making (see chart for details).     Patient presents to ED for evaluation of recheck of left eye conjunctivitis and corneal abrasion that was diagnosed here in the ED 10 days ago.  He has been using his antibiotic eyedrops with improvement in the discharge and discomfort.  However, he is here to evaluate if he is able to return to work.  On physical exam and recheck of floor seen stain, there is no corneal abrasion or other abnormality noted.  There is mild conjunctival injection with no proptosis, pain with EOMs, photophobia or periorbital edema noted.  He is overall well-appearing.  He denies any changes to vision or additional injuries.  I believe that patient is safe to return to work with appropriate eye protection around possibly onset shards of metal.  We will give him follow-up for ophthalmologist if symptoms persist.  Advised to return for any severe or worsening symptoms.  Portions of this note were generated with Scientist, clinical (histocompatibility and immunogenetics). Dictation errors may occur despite best attempts at proofreading.   Final Clinical Impressions(s) / ED Diagnoses   Final diagnoses:  Abrasion of left cornea, subsequent encounter    ED Discharge Orders    None       Dietrich Pates, PA-C 07/23/17 1618    Gerhard Munch, MD 07/23/17 1700

## 2017-07-23 NOTE — ED Triage Notes (Signed)
Pt to ER for follow up of abrasion to left eye. States is red, but wants to make sure hes okay to go back to work tomorrow.

## 2018-01-06 ENCOUNTER — Encounter (HOSPITAL_COMMUNITY): Admission: EM | Disposition: A | Discharge status: Home Health | Payer: Self-pay | Source: Home / Self Care

## 2018-01-06 ENCOUNTER — Inpatient Hospital Stay (HOSPITAL_COMMUNITY): Payer: No Typology Code available for payment source | Admitting: Certified Registered"

## 2018-01-06 ENCOUNTER — Emergency Department (HOSPITAL_COMMUNITY): Payer: No Typology Code available for payment source

## 2018-01-06 ENCOUNTER — Encounter (HOSPITAL_COMMUNITY): Payer: Self-pay

## 2018-01-06 ENCOUNTER — Inpatient Hospital Stay (HOSPITAL_COMMUNITY)
Admission: EM | Admit: 2018-01-06 | Discharge: 2018-01-17 | DRG: 957 | Disposition: A | Discharge status: Home Health | Payer: No Typology Code available for payment source | Attending: General Surgery | Admitting: General Surgery

## 2018-01-06 DIAGNOSIS — S02609B Fracture of mandible, unspecified, initial encounter for open fracture: Secondary | ICD-10-CM | POA: Diagnosis present

## 2018-01-06 DIAGNOSIS — S270XXA Traumatic pneumothorax, initial encounter: Secondary | ICD-10-CM | POA: Diagnosis present

## 2018-01-06 DIAGNOSIS — S12600A Unspecified displaced fracture of seventh cervical vertebra, initial encounter for closed fracture: Secondary | ICD-10-CM | POA: Diagnosis present

## 2018-01-06 DIAGNOSIS — S02602B Fracture of unspecified part of body of left mandible, initial encounter for open fracture: Secondary | ICD-10-CM

## 2018-01-06 DIAGNOSIS — F419 Anxiety disorder, unspecified: Secondary | ICD-10-CM | POA: Diagnosis not present

## 2018-01-06 DIAGNOSIS — K5900 Constipation, unspecified: Secondary | ICD-10-CM | POA: Diagnosis not present

## 2018-01-06 DIAGNOSIS — R402252 Coma scale, best verbal response, oriented, at arrival to emergency department: Secondary | ICD-10-CM | POA: Diagnosis present

## 2018-01-06 DIAGNOSIS — J9601 Acute respiratory failure with hypoxia: Secondary | ICD-10-CM | POA: Diagnosis not present

## 2018-01-06 DIAGNOSIS — T1490XA Injury, unspecified, initial encounter: Secondary | ICD-10-CM | POA: Diagnosis present

## 2018-01-06 DIAGNOSIS — R402362 Coma scale, best motor response, obeys commands, at arrival to emergency department: Secondary | ICD-10-CM | POA: Diagnosis present

## 2018-01-06 DIAGNOSIS — S2242XA Multiple fractures of ribs, left side, initial encounter for closed fracture: Secondary | ICD-10-CM

## 2018-01-06 DIAGNOSIS — Z590 Homelessness: Secondary | ICD-10-CM

## 2018-01-06 DIAGNOSIS — S80811A Abrasion, right lower leg, initial encounter: Secondary | ICD-10-CM | POA: Diagnosis present

## 2018-01-06 DIAGNOSIS — Z419 Encounter for procedure for purposes other than remedying health state, unspecified: Secondary | ICD-10-CM

## 2018-01-06 DIAGNOSIS — S80812A Abrasion, left lower leg, initial encounter: Secondary | ICD-10-CM | POA: Diagnosis present

## 2018-01-06 DIAGNOSIS — Y906 Blood alcohol level of 120-199 mg/100 ml: Secondary | ICD-10-CM | POA: Diagnosis present

## 2018-01-06 DIAGNOSIS — R402142 Coma scale, eyes open, spontaneous, at arrival to emergency department: Secondary | ICD-10-CM | POA: Diagnosis present

## 2018-01-06 DIAGNOSIS — J9690 Respiratory failure, unspecified, unspecified whether with hypoxia or hypercapnia: Secondary | ICD-10-CM

## 2018-01-06 DIAGNOSIS — Z23 Encounter for immunization: Secondary | ICD-10-CM | POA: Diagnosis not present

## 2018-01-06 DIAGNOSIS — S50811A Abrasion of right forearm, initial encounter: Secondary | ICD-10-CM | POA: Diagnosis present

## 2018-01-06 DIAGNOSIS — S50812A Abrasion of left forearm, initial encounter: Secondary | ICD-10-CM | POA: Diagnosis present

## 2018-01-06 DIAGNOSIS — S062X0A Diffuse traumatic brain injury without loss of consciousness, initial encounter: Secondary | ICD-10-CM | POA: Diagnosis present

## 2018-01-06 DIAGNOSIS — S42025A Nondisplaced fracture of shaft of left clavicle, initial encounter for closed fracture: Secondary | ICD-10-CM | POA: Diagnosis present

## 2018-01-06 DIAGNOSIS — S21112A Laceration without foreign body of left front wall of thorax without penetration into thoracic cavity, initial encounter: Secondary | ICD-10-CM | POA: Diagnosis present

## 2018-01-06 DIAGNOSIS — S02672B Fracture of alveolus of left mandible, initial encounter for open fracture: Principal | ICD-10-CM | POA: Diagnosis present

## 2018-01-06 DIAGNOSIS — F430 Acute stress reaction: Secondary | ICD-10-CM | POA: Diagnosis not present

## 2018-01-06 DIAGNOSIS — S0101XA Laceration without foreign body of scalp, initial encounter: Secondary | ICD-10-CM | POA: Diagnosis present

## 2018-01-06 DIAGNOSIS — S5402XA Injury of ulnar nerve at forearm level, left arm, initial encounter: Secondary | ICD-10-CM | POA: Diagnosis present

## 2018-01-06 DIAGNOSIS — S22019A Unspecified fracture of first thoracic vertebra, initial encounter for closed fracture: Secondary | ICD-10-CM | POA: Diagnosis present

## 2018-01-06 DIAGNOSIS — T148XXA Other injury of unspecified body region, initial encounter: Secondary | ICD-10-CM

## 2018-01-06 DIAGNOSIS — S3282XA Multiple fractures of pelvis without disruption of pelvic ring, initial encounter for closed fracture: Secondary | ICD-10-CM

## 2018-01-06 DIAGNOSIS — D62 Acute posthemorrhagic anemia: Secondary | ICD-10-CM | POA: Diagnosis not present

## 2018-01-06 DIAGNOSIS — S32810A Multiple fractures of pelvis with stable disruption of pelvic ring, initial encounter for closed fracture: Secondary | ICD-10-CM | POA: Diagnosis present

## 2018-01-06 DIAGNOSIS — S42022A Displaced fracture of shaft of left clavicle, initial encounter for closed fracture: Secondary | ICD-10-CM | POA: Diagnosis present

## 2018-01-06 DIAGNOSIS — S27321A Contusion of lung, unilateral, initial encounter: Secondary | ICD-10-CM | POA: Diagnosis present

## 2018-01-06 DIAGNOSIS — S2243XA Multiple fractures of ribs, bilateral, initial encounter for closed fracture: Secondary | ICD-10-CM | POA: Diagnosis present

## 2018-01-06 DIAGNOSIS — Z9289 Personal history of other medical treatment: Secondary | ICD-10-CM

## 2018-01-06 HISTORY — PX: LACERATION REPAIR: SHX5284

## 2018-01-06 HISTORY — PX: ORIF MANDIBULAR FRACTURE: SHX2127

## 2018-01-06 HISTORY — DX: Displaced fracture of shaft of left clavicle, initial encounter for closed fracture: S42.022A

## 2018-01-06 LAB — PREPARE FRESH FROZEN PLASMA

## 2018-01-06 LAB — BLOOD GAS, ARTERIAL
Acid-base deficit: 0.9 mmol/L (ref 0.0–2.0)
Bicarbonate: 22.7 mmol/L (ref 20.0–28.0)
FIO2: 40
MECHVT: 600 mL
O2 Saturation: 99.3 %
PEEP: 5 cmH2O
Patient temperature: 98.1
RATE: 18 resp/min
pCO2 arterial: 33.7 mmHg (ref 32.0–48.0)
pH, Arterial: 7.441 (ref 7.350–7.450)
pO2, Arterial: 180 mmHg — ABNORMAL HIGH (ref 83.0–108.0)

## 2018-01-06 LAB — COMPREHENSIVE METABOLIC PANEL
ALBUMIN: 4 g/dL (ref 3.5–5.0)
ALT: 35 U/L (ref 0–44)
AST: 81 U/L — AB (ref 15–41)
Alkaline Phosphatase: 112 U/L (ref 38–126)
Anion gap: 12 (ref 5–15)
BILIRUBIN TOTAL: 1.5 mg/dL — AB (ref 0.3–1.2)
BUN: 9 mg/dL (ref 6–20)
CO2: 19 mmol/L — ABNORMAL LOW (ref 22–32)
CREATININE: 1.27 mg/dL — AB (ref 0.61–1.24)
Calcium: 8.3 mg/dL — ABNORMAL LOW (ref 8.9–10.3)
Chloride: 105 mmol/L (ref 98–111)
GFR calc Af Amer: >60 mL/min (ref >60)
GLUCOSE: 119 mg/dL — AB (ref 70–99)
POTASSIUM: 4.2 mmol/L (ref 3.5–5.1)
Sodium: 136 mmol/L (ref 135–145)
TOTAL PROTEIN: 6.5 g/dL (ref 6.5–8.1)

## 2018-01-06 LAB — CBC
HEMATOCRIT: 44.3 % (ref 39.0–52.0)
HEMATOCRIT: 38.1 % — AB (ref 39.0–52.0)
Hemoglobin: 14 g/dL (ref 13.0–17.0)
Hemoglobin: 12.6 g/dL — ABNORMAL LOW (ref 13.0–17.0)
MCH: 27.9 pg (ref 26.0–34.0)
MCH: 28.4 pg (ref 26.0–34.0)
MCHC: 31.6 g/dL (ref 30.0–36.0)
MCHC: 33.1 g/dL (ref 30.0–36.0)
MCV: 88.2 fL (ref 80.0–100.0)
MCV: 86 fL (ref 80.0–100.0)
PLATELETS: 231 10*3/uL (ref 150–400)
Platelets: 344 10*3/uL (ref 150–400)
RBC: 5.02 MIL/uL (ref 4.22–5.81)
RBC: 4.43 MIL/uL (ref 4.22–5.81)
RDW: 13.8 % (ref 11.5–15.5)
RDW: 14.1 % (ref 11.5–15.5)
WBC: 13.9 10*3/uL — ABNORMAL HIGH (ref 4.0–10.5)
WBC: 18.5 10*3/uL — ABNORMAL HIGH (ref 4.0–10.5)
nRBC: 0 % (ref 0.0–0.2)
nRBC: 0 % (ref 0.0–0.2)

## 2018-01-06 LAB — TYPE AND SCREEN
ABO/RH(D): A POS
Antibody Screen: NEGATIVE
UNIT DIVISION: 0
Unit division: 0

## 2018-01-06 LAB — I-STAT CG4 LACTIC ACID, ED: Lactic Acid, Venous: 4.73 mmol/L (ref 0.5–1.9)

## 2018-01-06 LAB — BPAM RBC
Blood Product Expiration Date: 201911102359
Blood Product Expiration Date: 201911112359
ISSUE DATE / TIME: 201910120353
ISSUE DATE / TIME: 201910120353
UNIT TYPE AND RH: 9500
Unit Type and Rh: 9500

## 2018-01-06 LAB — BPAM FFP
BLOOD PRODUCT EXPIRATION DATE: 201910152359
Blood Product Expiration Date: 201910152359
ISSUE DATE / TIME: 201910120353
ISSUE DATE / TIME: 201910120353
UNIT TYPE AND RH: 6200
UNIT TYPE AND RH: 6200

## 2018-01-06 LAB — I-STAT CHEM 8, ED
BUN: 9 mg/dL (ref 6–20)
CHLORIDE: 104 mmol/L (ref 98–111)
CREATININE: 1.3 mg/dL — AB (ref 0.61–1.24)
Calcium, Ion: 0.99 mmol/L — ABNORMAL LOW (ref 1.15–1.40)
Glucose, Bld: 120 mg/dL — ABNORMAL HIGH (ref 70–99)
HCT: 40 % (ref 39.0–52.0)
Hemoglobin: 13.6 g/dL (ref 13.0–17.0)
Potassium: 3.5 mmol/L (ref 3.5–5.1)
Sodium: 139 mmol/L (ref 135–145)
TCO2: 22 mmol/L (ref 22–32)

## 2018-01-06 LAB — TRIGLYCERIDES: Triglycerides: 62 mg/dL (ref <150)

## 2018-01-06 LAB — BASIC METABOLIC PANEL
Anion gap: 6 (ref 5–15)
BUN: 12 mg/dL (ref 6–20)
CHLORIDE: 108 mmol/L (ref 98–111)
CO2: 22 mmol/L (ref 22–32)
CREATININE: 1.26 mg/dL — AB (ref 0.61–1.24)
Calcium: 8 mg/dL — ABNORMAL LOW (ref 8.9–10.3)
Glucose, Bld: 160 mg/dL — ABNORMAL HIGH (ref 70–99)
POTASSIUM: 4.5 mmol/L (ref 3.5–5.1)
SODIUM: 136 mmol/L (ref 135–145)

## 2018-01-06 LAB — ABO/RH: ABO/RH(D): A POS

## 2018-01-06 LAB — PROTIME-INR
INR: 1.2
Prothrombin Time: 15.1 seconds (ref 11.4–15.2)

## 2018-01-06 LAB — MRSA PCR SCREENING: MRSA by PCR: NEGATIVE

## 2018-01-06 LAB — CDS SEROLOGY

## 2018-01-06 LAB — ETHANOL: ALCOHOL ETHYL (B): 165 mg/dL — AB (ref <10)

## 2018-01-06 SURGERY — REPAIR, LACERATION, 2 OR MORE
Anesthesia: General

## 2018-01-06 MED ORDER — SUGAMMADEX SODIUM 200 MG/2ML IV SOLN
INTRAVENOUS | Status: DC | PRN
  Administered 2018-01-06: 200 mg via INTRAVENOUS

## 2018-01-06 MED ORDER — HYDRALAZINE HCL 20 MG/ML IJ SOLN: 10.0000 mg | INTRAMUSCULAR | Status: DC | PRN

## 2018-01-06 MED ORDER — FENTANYL CITRATE (PF) 100 MCG/2ML IJ SOLN
100.0000 ug | INTRAMUSCULAR | Status: DC | PRN
  Administered 2018-01-06 – 2018-01-07 (×5): 100 ug via INTRAVENOUS
  Filled 2018-01-06 (×3): qty 2

## 2018-01-06 MED ORDER — ROCURONIUM BROMIDE 100 MG/10ML IV SOLN: INTRAVENOUS | Status: DC | PRN

## 2018-01-06 MED ORDER — SODIUM CHLORIDE 0.9 % IV SOLN
INTRAVENOUS | Status: DC | PRN
  Administered 2018-01-06: 20 ug/min via INTRAVENOUS

## 2018-01-06 MED ORDER — 0.9 % SODIUM CHLORIDE (POUR BTL) OPTIME
TOPICAL | Status: DC | PRN
  Administered 2018-01-06: 1000 mL

## 2018-01-06 MED ORDER — HYDRALAZINE HCL 20 MG/ML IJ SOLN
INTRAMUSCULAR | Status: AC
  Administered 2018-01-06: 10 mg via INTRAVENOUS
  Filled 2018-01-06: qty 1

## 2018-01-06 MED ORDER — LIDOCAINE-EPINEPHRINE 1 %-1:100000 IJ SOLN
INTRAMUSCULAR | Status: DC | PRN
  Administered 2018-01-06: .000001 mL

## 2018-01-06 MED ORDER — FENTANYL CITRATE (PF) 250 MCG/5ML IJ SOLN
INTRAMUSCULAR | Status: AC
  Filled 2018-01-06: qty 5

## 2018-01-06 MED ORDER — HYDROMORPHONE HCL 1 MG/ML IJ SOLN
INTRAMUSCULAR | Status: AC
  Filled 2018-01-06: qty 1

## 2018-01-06 MED ORDER — OXYMETAZOLINE HCL 0.05 % NA SOLN
NASAL | Status: AC
  Filled 2018-01-06: qty 15

## 2018-01-06 MED ORDER — SENNOSIDES 8.8 MG/5ML PO SYRP
5.0000 mL | ORAL_SOLUTION | Freq: Two times a day (BID) | ORAL | Status: DC | PRN
  Administered 2018-01-10 – 2018-01-14 (×2): 5 mL
  Filled 2018-01-06 (×2): qty 5

## 2018-01-06 MED ORDER — PROPOFOL 10 MG/ML IV BOLUS
INTRAVENOUS | Status: AC
  Filled 2018-01-06: qty 40

## 2018-01-06 MED ORDER — ROCURONIUM BROMIDE 50 MG/5ML IV SOSY
PREFILLED_SYRINGE | INTRAVENOUS | Status: DC | PRN
  Administered 2018-01-06: 40 mg via INTRAVENOUS

## 2018-01-06 MED ORDER — FENTANYL CITRATE (PF) 100 MCG/2ML IJ SOLN
100.0000 ug | INTRAMUSCULAR | Status: DC | PRN
  Administered 2018-01-06 (×2): 100 ug via INTRAVENOUS
  Filled 2018-01-06: qty 2

## 2018-01-06 MED ORDER — HYDROMORPHONE HCL 1 MG/ML IJ SOLN: 0.2500 mg | INTRAMUSCULAR | Status: DC | PRN

## 2018-01-06 MED ORDER — CLINDAMYCIN PHOSPHATE 300 MG/50ML IV SOLN
300.0000 mg | Freq: Three times a day (TID) | INTRAVENOUS | Status: AC
  Administered 2018-01-06 – 2018-01-14 (×25): 300 mg via INTRAVENOUS
  Filled 2018-01-06 (×25): qty 50

## 2018-01-06 MED ORDER — ONDANSETRON HCL 4 MG/2ML IJ SOLN
INTRAMUSCULAR | Status: DC | PRN
  Administered 2018-01-06: 4 mg via INTRAVENOUS

## 2018-01-06 MED ORDER — FAMOTIDINE IN NACL 20-0.9 MG/50ML-% IV SOLN
20.0000 mg | INTRAVENOUS | Status: DC
  Administered 2018-01-06 – 2018-01-08 (×3): 20 mg via INTRAVENOUS
  Filled 2018-01-06 (×3): qty 50

## 2018-01-06 MED ORDER — LIDOCAINE-EPINEPHRINE 1 %-1:100000 IJ SOLN
INTRAMUSCULAR | Status: AC
  Filled 2018-01-06: qty 1

## 2018-01-06 MED ORDER — BISACODYL 10 MG RE SUPP: 10.0000 mg | Freq: Every day | RECTAL | Status: DC | PRN

## 2018-01-06 MED ORDER — PROPOFOL 500 MG/50ML IV EMUL
INTRAVENOUS | Status: DC | PRN
  Administered 2018-01-06: 50 ug/kg/min via INTRAVENOUS

## 2018-01-06 MED ORDER — FENTANYL CITRATE (PF) 100 MCG/2ML IJ SOLN
INTRAMUSCULAR | Status: AC
  Administered 2018-01-06: 100 ug via INTRAVENOUS
  Filled 2018-01-06: qty 2

## 2018-01-06 MED ORDER — DEXAMETHASONE SODIUM PHOSPHATE 10 MG/ML IJ SOLN
INTRAMUSCULAR | Status: DC | PRN
  Administered 2018-01-06: 10 mg via INTRAVENOUS

## 2018-01-06 MED ORDER — PROPOFOL 1000 MG/100ML IV EMUL
0.0000 ug/kg/min | INTRAVENOUS | Status: DC
  Administered 2018-01-06: 5 ug/kg/min via INTRAVENOUS
  Administered 2018-01-07: 30 ug/kg/min via INTRAVENOUS
  Administered 2018-01-07: 25 ug/kg/min via INTRAVENOUS
  Filled 2018-01-06 (×2): qty 100

## 2018-01-06 MED ORDER — ARTIFICIAL TEARS OPHTHALMIC OINT
TOPICAL_OINTMENT | OPHTHALMIC | Status: DC | PRN
  Administered 2018-01-06: 1 via OPHTHALMIC

## 2018-01-06 MED ORDER — HYDROMORPHONE HCL 1 MG/ML IJ SOLN: 0.5000 mg | INTRAMUSCULAR | Status: DC | PRN

## 2018-01-06 MED ORDER — FENTANYL CITRATE (PF) 100 MCG/2ML IJ SOLN
50.0000 ug | Freq: Once | INTRAMUSCULAR | Status: AC
  Administered 2018-01-06: 50 ug via INTRAVENOUS
  Filled 2018-01-06: qty 2

## 2018-01-06 MED ORDER — LACTATED RINGERS IV SOLN
INTRAVENOUS | Status: DC | PRN
  Administered 2018-01-06: 10:00:00 via INTRAVENOUS

## 2018-01-06 MED ORDER — BACITRACIN ZINC 500 UNIT/GM EX OINT
TOPICAL_OINTMENT | CUTANEOUS | Status: DC | PRN
  Administered 2018-01-06: 1 via TOPICAL

## 2018-01-06 MED ORDER — IOPAMIDOL (ISOVUE-370) INJECTION 76%
100.0000 mL | Freq: Once | INTRAVENOUS | Status: AC | PRN
  Administered 2018-01-06: 100 mL via INTRAVENOUS

## 2018-01-06 MED ORDER — LABETALOL HCL 5 MG/ML IV SOLN
INTRAVENOUS | Status: AC
  Filled 2018-01-06: qty 4

## 2018-01-06 MED ORDER — EPHEDRINE SULFATE 50 MG/ML IJ SOLN
INTRAMUSCULAR | Status: DC | PRN
  Administered 2018-01-06 (×2): 5 mg via INTRAVENOUS

## 2018-01-06 MED ORDER — CEFAZOLIN SODIUM-DEXTROSE 2-3 GM-%(50ML) IV SOLR
INTRAVENOUS | Status: DC | PRN
  Administered 2018-01-06: 2 g via INTRAVENOUS

## 2018-01-06 MED ORDER — LIDOCAINE 2% (20 MG/ML) 5 ML SYRINGE
INTRAMUSCULAR | Status: DC | PRN
  Administered 2018-01-06: 60 mg via INTRAVENOUS

## 2018-01-06 MED ORDER — CEFAZOLIN SODIUM-DEXTROSE 2-4 GM/100ML-% IV SOLN
2.0000 g | Freq: Once | INTRAVENOUS | Status: AC
  Administered 2018-01-06: 2 g via INTRAVENOUS
  Filled 2018-01-06: qty 100

## 2018-01-06 MED ORDER — CLINDAMYCIN HCL 300 MG PO CAPS
300.0000 mg | ORAL_CAPSULE | Freq: Three times a day (TID) | ORAL | Status: DC
  Filled 2018-01-06 (×2): qty 1

## 2018-01-06 MED ORDER — FENTANYL CITRATE (PF) 100 MCG/2ML IJ SOLN
INTRAMUSCULAR | Status: DC | PRN
  Administered 2018-01-06: 100 ug via INTRAVENOUS
  Administered 2018-01-06: 75 ug via INTRAVENOUS

## 2018-01-06 MED ORDER — SUCCINYLCHOLINE 20MG/ML (10ML) SYRINGE FOR MEDFUSION PUMP - OPTIME
INTRAMUSCULAR | Status: DC | PRN
  Administered 2018-01-06: 120 mg via INTRAVENOUS

## 2018-01-06 MED ORDER — GLYCOPYRROLATE PF 0.2 MG/ML IJ SOSY
PREFILLED_SYRINGE | INTRAMUSCULAR | Status: DC | PRN
  Administered 2018-01-06: 8 mg via INTRAVENOUS

## 2018-01-06 MED ORDER — POTASSIUM CHLORIDE IN NACL 20-0.9 MEQ/L-% IV SOLN
INTRAVENOUS | Status: DC
  Administered 2018-01-06 – 2018-01-07 (×3): via INTRAVENOUS
  Administered 2018-01-08: 1000 mL via INTRAVENOUS
  Administered 2018-01-09 – 2018-01-11 (×3): via INTRAVENOUS
  Filled 2018-01-06 (×8): qty 1000

## 2018-01-06 MED ORDER — BACITRACIN ZINC 500 UNIT/GM EX OINT
TOPICAL_OINTMENT | CUTANEOUS | Status: AC
  Filled 2018-01-06: qty 28.35

## 2018-01-06 MED ORDER — LACTATED RINGERS IV SOLN
INTRAVENOUS | Status: DC
  Administered 2018-01-06: 09:00:00 via INTRAVENOUS

## 2018-01-06 MED ORDER — OXYMETAZOLINE HCL 0.05 % NA SOLN
NASAL | Status: AC
  Administered 2018-01-06: 2 via NASAL
  Filled 2018-01-06: qty 15

## 2018-01-06 MED ORDER — PROPOFOL 10 MG/ML IV BOLUS
INTRAVENOUS | Status: DC | PRN
  Administered 2018-01-06: 200 mg via INTRAVENOUS

## 2018-01-06 MED ORDER — OXYMETAZOLINE HCL 0.05 % NA SOLN
2.0000 | NASAL | Status: DC
  Administered 2018-01-06: 2 via NASAL

## 2018-01-06 MED ORDER — ONDANSETRON 4 MG PO TBDP: 4.0000 mg | ORAL_TABLET | Freq: Four times a day (QID) | ORAL | Status: DC | PRN

## 2018-01-06 MED ORDER — DEXMEDETOMIDINE HCL IN NACL 200 MCG/50ML IV SOLN
0.4000 ug/kg/h | INTRAVENOUS | Status: DC
  Administered 2018-01-06: 0.8 ug/kg/h via INTRAVENOUS

## 2018-01-06 MED ORDER — TETANUS-DIPHTH-ACELL PERTUSSIS 5-2.5-18.5 LF-MCG/0.5 IM SUSP
0.5000 mL | Freq: Once | INTRAMUSCULAR | Status: AC
  Administered 2018-01-06: 0.5 mL via INTRAMUSCULAR
  Filled 2018-01-06: qty 0.5

## 2018-01-06 MED ORDER — CLINDAMYCIN PHOSPHATE 600 MG/50ML IV SOLN
600.0000 mg | Freq: Three times a day (TID) | INTRAVENOUS | Status: DC
  Administered 2018-01-06: 600 mg via INTRAVENOUS
  Filled 2018-01-06 (×2): qty 50

## 2018-01-06 MED ORDER — FENTANYL CITRATE (PF) 100 MCG/2ML IJ SOLN
INTRAMUSCULAR | Status: AC
  Filled 2018-01-06: qty 2

## 2018-01-06 MED ORDER — ONDANSETRON HCL 4 MG/2ML IJ SOLN
4.0000 mg | Freq: Four times a day (QID) | INTRAMUSCULAR | Status: DC | PRN
  Administered 2018-01-06: 4 mg via INTRAVENOUS
  Filled 2018-01-06: qty 2

## 2018-01-06 MED ORDER — SODIUM CHLORIDE 0.9 % IV BOLUS
500.0000 mL | Freq: Once | INTRAVENOUS | Status: AC
  Administered 2018-01-06: 500 mL via INTRAVENOUS

## 2018-01-06 MED ORDER — HYDRALAZINE HCL 20 MG/ML IJ SOLN
10.0000 mg | Freq: Once | INTRAMUSCULAR | Status: AC
  Administered 2018-01-06: 10 mg via INTRAVENOUS

## 2018-01-06 MED ORDER — HYDROMORPHONE HCL 1 MG/ML IJ SOLN: 1.0000 mg | INTRAMUSCULAR | Status: DC | PRN

## 2018-01-06 MED ORDER — PHENYLEPHRINE HCL 10 MG/ML IJ SOLN
INTRAMUSCULAR | Status: DC | PRN
  Administered 2018-01-06 (×5): 100 ug via INTRAVENOUS

## 2018-01-06 SURGICAL SUPPLY — 34 items
BAR ARCH PREFORM MXLMNDB FX (Miscellaneous) ×2 IMPLANT
BAR FIX PREFORMED OMNIMAX (Miscellaneous) ×4 IMPLANT
BIT DRILL RAINBOW 1.6X35 (BIT) ×2 IMPLANT
CANISTER SUCT 3000ML PPV (MISCELLANEOUS) ×3 IMPLANT
CLEANER TIP ELECTROSURG 2X2 (MISCELLANEOUS) ×3 IMPLANT
COVER SURGICAL LIGHT HANDLE (MISCELLANEOUS) ×3 IMPLANT
COVER WAND RF STERILE (DRAPES) ×3 IMPLANT
DRAIN PENROSE 18X1/4 LTX STRL (WOUND CARE) ×2 IMPLANT
ELECT COATED BLADE 2.86 ST (ELECTRODE) ×3 IMPLANT
ELECT REM PT RETURN 9FT ADLT (ELECTROSURGICAL) ×3
ELECTRODE REM PT RTRN 9FT ADLT (ELECTROSURGICAL) ×1 IMPLANT
GAUZE SPONGE 4X4 12PLY STRL (GAUZE/BANDAGES/DRESSINGS) ×2 IMPLANT
GLOVE ECLIPSE 7.5 STRL STRAW (GLOVE) ×3 IMPLANT
GOWN STRL REUS W/ TWL LRG LVL3 (GOWN DISPOSABLE) ×2 IMPLANT
GOWN STRL REUS W/TWL LRG LVL3 (GOWN DISPOSABLE) ×9
KIT BASIN OR (CUSTOM PROCEDURE TRAY) ×3 IMPLANT
KIT TURNOVER KIT B (KITS) ×3 IMPLANT
NDL PRECISIONGLIDE 27X1.5 (NEEDLE) ×1 IMPLANT
NEEDLE PRECISIONGLIDE 27X1.5 (NEEDLE) ×3 IMPLANT
NS IRRIG 1000ML POUR BTL (IV SOLUTION) ×3 IMPLANT
PAD ARMBOARD 7.5X6 YLW CONV (MISCELLANEOUS) ×6 IMPLANT
PENCIL FOOT CONTROL (ELECTRODE) ×3 IMPLANT
PLATE RECON 2.5 11H (Plate) ×2 IMPLANT
SCISSORS WIRE ANG 4 3/4 DISP (INSTRUMENTS) ×3 IMPLANT
SCREW 2.0X12MM (Screw) ×8 IMPLANT
SCREW BONE 2X7 CROSS DRIVE (Screw) ×6 IMPLANT
SCREW BONE MANDIB SD 2X11 (Screw) ×6 IMPLANT
SCREW MNDBLE 2.0X14 LOCKING (Screw) ×4 IMPLANT
SCREW MNDBLE 2.0X16 LOCKING (Screw) ×2 IMPLANT
SUT CHROMIC 3 0 PS 2 (SUTURE) ×4 IMPLANT
SUT ETHILON 4 0 PS 2 18 (SUTURE) ×2 IMPLANT
SUT STEEL 4 (SUTURE) ×2 IMPLANT
TOWEL OR 17X24 6PK STRL BLUE (TOWEL DISPOSABLE) ×3 IMPLANT
TRAY ENT MC OR (CUSTOM PROCEDURE TRAY) ×3 IMPLANT

## 2018-01-06 NOTE — Progress Notes (Signed)
   01/06/18 0700  Clinical Encounter Type  Visited With Patient  Visit Type Follow-up  Referral From Nurse  Spiritual Encounters  Spiritual Needs Prayer;Emotional  Stress Factors  Patient Stress Factors None identified   Responded to page from Nurse. Nurse stated Pt wanted mother contacted and gave me number. PT was alert and talked briefly. He said that he wanted me to pray with him. I offered PT spiritual care with ministry of presence, words of encouragement and prayer. I called family contact number repeatedly and there was no response or option to leave message. Chaplain available as needed.   Chaplain Orest Dikes  [phone]
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
## 2018-01-06 NOTE — H&P (Signed)
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 oriented to person, place, and time. He appears well-developed and well-nourished. No distress.  HENT:  Head:    Right Ear: External ear normal.  Left Ear: External ear normal.  Nose: Nose normal.  Multiple abrasions, open deformity with laceration overlying left mandible fracture  Eyes: Pupils are equal, round, and reactive to light. EOM are normal.  Neck:  Posterior midline tenderness lower cervical spine, collar in place  Cardiovascular: Normal rate, regular rhythm, normal heart sounds and intact distal pulses.  Respiratory: Effort normal and breath sounds normal. No respiratory distress. He has no wheezes. He has no rales.    3 cm laceration left chest anteriorly, multiple abrasions  GI: Soft. He exhibits no distension. There is no tenderness. There is no rebound and no guarding.  Musculoskeletal:       Arms:      Legs: Multiple abrasions bilateral lower extremities, bilateral forearms  Neurological: He is alert and oriented to person, place, and time. He displays no atrophy and no tremor. No cranial nerve deficit. He exhibits  normal muscle tone. He displays no seizure activity. GCS eye subscore is 4. GCS verbal subscore is 5. GCS motor subscore is 6.  Skin: Skin is warm.  Psychiatric: He has a normal mood and affect.     Assessment/Plan PHBC C7 transverse process fracture - cervical collar, Dr. Christella Noa to consult Scalp laceration -I closed in ED Left open mandible fracture -Dr. Constance Holster to consult and repair Right rib fracture 5, 6, left rib fracture 1-5 -  Multimodal pain control and pulmonary toilet Pelvic ring fracture including left sacral ala and right pubic rami -Dr. Marlou Sa to consult ETOH [id number]  Admit to trauma, stepdown unit Critical care 50 minutes  Zenovia Jarred, MD 01/06/2018, 6:57 AM

## 2018-01-06 NOTE — Anesthesia Preprocedure Evaluation (Addendum)
Anesthesia Evaluation  Patient identified by MRN, date of birth, ID band Patient awake    Reviewed: Allergy & Precautions, H&P , NPO status , Patient's Chart, lab work & pertinent test results  Airway Mallampati: III  TM Distance: >3 FB Neck ROM: Full    Dental no notable dental hx. (+) Chipped, Dental Advisory Given   Pulmonary neg pulmonary ROS,    Pulmonary exam normal breath sounds clear to auscultation       Cardiovascular negative cardio ROS   Rhythm:Regular Rate:Normal     Neuro/Psych negative neurological ROS  negative psych ROS   GI/Hepatic negative GI ROS, Neg liver ROS,   Endo/Other  negative endocrine ROS  Renal/GU negative Renal ROS  negative genitourinary   Musculoskeletal   Abdominal   Peds  Hematology negative hematology ROS (+)   Anesthesia Other Findings   Reproductive/Obstetrics negative OB ROS                            Anesthesia Physical Anesthesia Plan  ASA: I and emergent  Anesthesia Plan: General   Post-op Pain Management:    Induction: Intravenous, Rapid sequence and Cricoid pressure planned  PONV Risk Score and Plan: 3 and Midazolam, Dexamethasone and Ondansetron  Airway Management Planned: Nasal ETT and Video Laryngoscope Planned  Additional Equipment:   Intra-op Plan:   Post-operative Plan: Extubation in OR  Informed Consent: I have reviewed the patients History and Physical, chart, labs and discussed the procedure including the risks, benefits and alternatives for the proposed anesthesia with the patient or authorized representative who has indicated his/her understanding and acceptance.   Dental advisory given  Plan Discussed with: CRNA  Anesthesia Plan Comments:        Anesthesia Quick Evaluation

## 2018-01-06 NOTE — Op Note (Addendum)
OPERATIVE REPORT  DATE OF SURGERY: 01/06/2018  PATIENT:  Ricky Graham,  27 y.o. male  PRE-OPERATIVE DIAGNOSIS:  Left mandibular ramus fracture with mild displacement   POST-OPERATIVE DIAGNOSIS:  Left mandibular ramus fracture with mild displacement   PROCEDURE:  Procedure(s): REPAIR MULTIPLE LACERATIONS & MMF OPEN REDUCTION INTERNAL FIXATION (ORIF) MANDIBULAR FRACTURE  SURGEON:  Susy Frizzle, MD  ASSISTANTS: None  ANESTHESIA:   General   EBL: 50 ml  DRAINS: Quarter-inch JP  LOCAL MEDICATIONS USED:  None  SPECIMEN:  none  COUNTS:  Correct  PROCEDURE DETAILS: The patient was taken to the operating room and placed on the operating table in the supine position. Following induction of general endotracheal anesthesia, the face and neck were prepped and draped in standard fashion.  The laceration was explored and a large fragment of bone was free-floating and removed easily.  The laceration measured approximately 10 cm.  The fracture was easily identified through the wound.  Maxilla mandibular fixation was then accomplished.  A mucosal laceration was reapproximated with running 3-0 chromic suture.  This was posteriorly along the lateral gutter overlying the fracture site.  The [employer] system was used.  3 screws were used on each arch bar.  22-gauge wires were used to establish occlusion.  At the end of the case these were removed and elastics were placed.  The wound was explored externally.  Placed self-retaining retractors were used.  Periosteum was cleaned off of the mandible to allow exposure anterior and posterior to the comminuted fracture.  There is a large comminuted segments along the horizontal ramus.  A [employer] reconstruction plate was used.  This was cut to size and shape and bent accordingly for maximal fit.  Screw holes were drilled in the anterior segment first to secure the plate in place, depth gauge was used and appropriate depth rings were used throughout.  2 screws  were used in the anterior segment.  2 screws were used in the posterior segment near the angle.  2 screws were used in the comminuted segment.  There is excellent reapproximation and reduction and stable fixation.  The wound was irrigated with saline.  The mental nerve and mental foramen were anterior to the fracture and were kept intact.  The drain was secured in place to the skin and placed in the depths of the wound.  The wound was closed in layers using interrupted 3-0 chromic in the deeper layers and running 4-0 nylon on the skin.  Bacitracin and a dressing was applied.  Patient was awakened extubated and transferred to recovery in stable condition.    PATIENT DISPOSITION:  To PACU, stable

## 2018-01-06 NOTE — ED Provider Notes (Signed)
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 Janee Morn, at bedside.  Patient does have a large open wound to his left neck in zone 3, open mandible fracture, trauma surgery will consult [facility].  CT of chest shows multiple left-sided rib fractures, with small left apical pneumothorax.  Also shows left upper lobe pulmonary contusion.  CT pelvis shows comminuted fracture of the right pubic bone traversing the inferior and superior pubic rami without extension into the right hip.  She also has left midshaft clavicular fracture.  Appreciate Dr. Janee Morn for admitting.  Final Clinical Impressions(s) / ED Diagnoses   Final diagnoses:  Trauma  Open fracture of left side of mandibular body, initial encounter (HCC)  Closed fracture of multiple ribs of left side, initial encounter  Closed nondisplaced fracture of shaft of left clavicle, initial encounter  Multiple closed fractures of pelvis, initial encounter [facility])    ED Discharge Orders    None       Roxy Horseman, PA-C 01/06/18 0617    Gilda Crease, MD 01/06/18 [phone]

## 2018-01-06 NOTE — Consult Note (Signed)
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 listed in admit H&P  Blood pressure 106/81, pulse (!) 108, temperature 97.8 F (36.6 C), resp. rate 18, height 6' (1.829 m), weight 81.6 kg, SpO2 100 %.  PHYSICAL EXAM: Overall appearance: Lying supine, able to answer questions, breathing easily Head: Multiple abrasions. Ears: External ears appear normal. Nose: External nose is healthy in appearance. Internal nasal exam free of any lesions or obstruction. Oral Cavity/Pharynx:  There are no mucosal lesions or masses identified.  There is some old blood in the left side oral cavity but the dentition appears to be intact.  He has a slight open bite deformity. Larynx/Hypopharynx: Deferred Neuro:  No identifiable neurologic deficits. Neck: Cervical collar in place.  Large left upper neck anterior open wound but the mandibular bone is not exposed..  Studies Reviewed: Maxillofacial CT  Procedures: none   Assessment/Plan: Left mandibular ramus fracture with mild displacement.  Open wound.  Recommend closure of laceration and MMF for treatment of the fracture.  Izora Gala 01/06/2018, 7:48 AM

## 2018-01-06 NOTE — ED Notes (Signed)
Stat dropped to 85%, placed on [place], increased to 6L

## 2018-01-06 NOTE — Anesthesia Postprocedure Evaluation (Signed)
Anesthesia Post Note  Patient: Ricky Graham  Procedure(s) Performed: REPAIR MULTIPLE LACERATIONS & MMF (N/A ) OPEN REDUCTION INTERNAL FIXATION (ORIF) MANDIBULAR FRACTURE (N/A )     Patient location during evaluation: PACU Anesthesia Type: General Level of consciousness: sedated Pain management: pain level controlled Vital Signs Assessment: post-procedure vital signs reviewed and stable Respiratory status: nonlabored ventilation, patient on ventilator - see flowsheet for VS and respiratory function unstable Cardiovascular status: blood pressure returned to baseline and stable Postop Assessment: no apparent nausea or vomiting Anesthetic complications: no Comments: Pt arousable but sleepy. Not meeting extubation criteria. Will send to ICU intubated.    Last Vitals:  Vitals:   01/06/18 1200 01/06/18 1216  BP:  137/75  Pulse:  98  Resp:  15  Temp:  36.5 C  SpO2: 100% 100%    Last Pain:  Vitals:   01/06/18 0609  PainSc: 10-Worst pain ever                 Amila Callies,W. EDMOND

## 2018-01-06 NOTE — Progress Notes (Signed)
Orthopedic Tech Progress Note Patient Details:  Ricky Graham [birth year] [id number]  Ortho Devices Type of Ortho Device: Arm sling Ortho Device/Splint Location: lue Ortho Device/Splint Interventions: Application   Post Interventions Instructions Provided: Care of device   Nikki Dom 01/06/2018, 5:40 PM

## 2018-01-06 NOTE — ED Notes (Signed)
Wallet locked up with security
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
## 2018-01-06 NOTE — Progress Notes (Signed)
Pt from PACU on vent.  RT will continue to monitor.

## 2018-01-06 NOTE — Transfer of Care (Signed)
Immediate Anesthesia Transfer of Care Note  Patient: Ricky Graham  Procedure(s) Performed: REPAIR MULTIPLE LACERATIONS & MMF (N/A ) OPEN REDUCTION INTERNAL FIXATION (ORIF) MANDIBULAR FRACTURE (N/A )  Patient Location: PACU  Anesthesia Type:General  Level of Consciousness: unresponsive  Airway & Oxygen Therapy: Patient remains intubated per anesthesia plan and Patient placed on Ventilator (see vital sign flow sheet for setting)  Post-op Assessment: Post -op Vital signs reviewed and stable  Post vital signs: Reviewed and stable  Last Vitals:  Vitals Value Taken Time  BP    Temp    Pulse 78 01/06/2018 12:34 PM  Resp 17 01/06/2018 12:34 PM  SpO2 100 % 01/06/2018 12:34 PM  Vitals shown include unvalidated device data.  Last Pain:  Vitals:   01/06/18 0609  PainSc: 10-Worst pain ever    Pt on vent psv not responsive at this time MD Sampson Goon aware      Complications: No apparent anesthesia complications

## 2018-01-06 NOTE — Anesthesia Procedure Notes (Signed)
Procedure Name: Intubation Date/Time: 01/06/2018 10:02 AM Performed by: Wilder Glade, CRNA Pre-anesthesia Checklist: Patient identified, Emergency Drugs available, Suction available, Patient being monitored and Timeout performed Patient Re-evaluated:Patient Re-evaluated prior to induction Oxygen Delivery Method: Circle system utilized Preoxygenation: Pre-oxygenation with 100% oxygen Induction Type: IV induction Laryngoscope Size: Glidescope and 3 Grade View: Grade I Nasal Tubes: Nasal Rae Tube size: 7.5 mm Number of attempts: 1 (brief nares pretreated in preop with oxymetazolone while patient awake tolerated without difficulty, note nasal RAE lubricated with ky tip to well above cuff) Placement Confirmation: ETT inserted through vocal cords under direct vision,  breath sounds checked- equal and bilateral and positive ETCO2 Secured at: 29 cm Tube secured with: Tape Dental Injury: Teeth and Oropharynx as per pre-operative assessment

## 2018-01-06 NOTE — Progress Notes (Signed)
Pt pointing to L shoulder - -mouths "pain" - sling placed per order by Rembert, -ortho tech

## 2018-01-06 NOTE — ED Notes (Signed)
Report given to Jamie,RN on 4N.

## 2018-01-06 NOTE — Progress Notes (Signed)
Patient ID: Ricky Graham, male   DOB: [birth year], 27 y.o.   MRN: [id number] orif mandible not waking up well. Initial head ct was negative, no history anticoagulants, reasonable to leave intubated and monitor in icu

## 2018-01-06 NOTE — ED Notes (Signed)
[name] returned page stating he will be down shortly.
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
## 2018-01-07 ENCOUNTER — Inpatient Hospital Stay (HOSPITAL_COMMUNITY): Payer: No Typology Code available for payment source

## 2018-01-07 LAB — BASIC METABOLIC PANEL
ANION GAP: 8 (ref 5–15)
BUN: 14 mg/dL (ref 6–20)
CALCIUM: 8.3 mg/dL — AB (ref 8.9–10.3)
CHLORIDE: 106 mmol/L (ref 98–111)
CO2: 24 mmol/L (ref 22–32)
Creatinine, Ser: 0.96 mg/dL (ref 0.61–1.24)
GFR calc Af Amer: >60 mL/min (ref >60)
GFR calc non Af Amer: >60 mL/min (ref >60)
GLUCOSE: 134 mg/dL — AB (ref 70–99)
Potassium: 4.8 mmol/L (ref 3.5–5.1)
Sodium: 138 mmol/L (ref 135–145)

## 2018-01-07 LAB — BLOOD PRODUCT ORDER (VERBAL) VERIFICATION

## 2018-01-07 LAB — CBC
HEMATOCRIT: 36 % — AB (ref 39.0–52.0)
HEMOGLOBIN: 11.3 g/dL — AB (ref 13.0–17.0)
MCH: 27.5 pg (ref 26.0–34.0)
MCHC: 31.4 g/dL (ref 30.0–36.0)
MCV: 87.6 fL (ref 80.0–100.0)
NRBC: 0 % (ref 0.0–0.2)
Platelets: 204 10*3/uL (ref 150–400)
RBC: 4.11 MIL/uL — ABNORMAL LOW (ref 4.22–5.81)
RDW: 14.3 % (ref 11.5–15.5)
WBC: 14.5 10*3/uL — ABNORMAL HIGH (ref 4.0–10.5)

## 2018-01-07 LAB — HIV ANTIBODY (ROUTINE TESTING W REFLEX): HIV Screen 4th Generation wRfx: NONREACTIVE

## 2018-01-07 MED ORDER — ENOXAPARIN SODIUM 40 MG/0.4ML ~~LOC~~ SOLN
40.0000 mg | SUBCUTANEOUS | Status: DC
  Administered 2018-01-07 – 2018-01-16 (×10): 40 mg via SUBCUTANEOUS
  Filled 2018-01-07 (×10): qty 0.4

## 2018-01-07 MED ORDER — ORAL CARE MOUTH RINSE
15.0000 mL | Freq: Two times a day (BID) | OROMUCOSAL | Status: DC
  Administered 2018-01-07 – 2018-01-17 (×18): 15 mL via OROMUCOSAL

## 2018-01-07 MED ORDER — HYDROMORPHONE HCL 1 MG/ML IJ SOLN
1.0000 mg | INTRAMUSCULAR | Status: DC | PRN
  Administered 2018-01-07 – 2018-01-10 (×16): 1 mg via INTRAVENOUS
  Filled 2018-01-07 (×17): qty 1

## 2018-01-07 MED ORDER — ORAL CARE MOUTH RINSE
15.0000 mL | OROMUCOSAL | Status: DC
  Administered 2018-01-07 (×2): 15 mL via OROMUCOSAL

## 2018-01-07 MED ORDER — HYDROCODONE-ACETAMINOPHEN 7.5-325 MG/15ML PO SOLN
15.0000 mL | ORAL | Status: DC | PRN
  Administered 2018-01-07 – 2018-01-08 (×3): 15 mL via ORAL
  Filled 2018-01-07 (×3): qty 15

## 2018-01-07 MED ORDER — DIPHENHYDRAMINE HCL 50 MG/ML IJ SOLN
25.0000 mg | Freq: Four times a day (QID) | INTRAMUSCULAR | Status: DC | PRN
  Administered 2018-01-07 – 2018-01-10 (×3): 25 mg via INTRAVENOUS
  Filled 2018-01-07 (×3): qty 1

## 2018-01-07 MED ORDER — CHLORHEXIDINE GLUCONATE 0.12% ORAL RINSE (MEDLINE KIT)
15.0000 mL | Freq: Two times a day (BID) | OROMUCOSAL | Status: DC
  Administered 2018-01-07: 15 mL via OROMUCOSAL

## 2018-01-07 MED ORDER — FENTANYL CITRATE (PF) 100 MCG/2ML IJ SOLN
50.0000 ug | INTRAMUSCULAR | Status: DC | PRN
  Administered 2018-01-07 (×3): 50 ug via INTRAVENOUS
  Filled 2018-01-07 (×3): qty 2

## 2018-01-07 NOTE — Procedures (Signed)
Extubation Procedure Note  Patient Details:   Name: Ricky Graham DOB: 07-Sep-1990 MRN: [id number]   Airway Documentation:  Airway 7.5 mm (Active)  Secured at (cm) 29 cm 01/07/2018  8:00 AM  Measured From Nare 01/07/2018  8:00 AM  Secured Location Right 01/07/2018  8:00 AM  Secured By [employer] 01/07/2018  8:00 AM  Cuff Pressure (cm H2O) 24 cm H2O 01/06/2018  7:32 PM  Site Condition Dry;Cool 01/07/2018  8:00 AM   Vent end date: (not recorded) Vent end time: (not recorded)   Evaluation  O2 sats: stable throughout Complications: No apparent complications Patient did tolerate procedure well. Bilateral Breath Sounds: Rhonchi   Yes   Pt extubated to 4L [place] with O2 sats 100%. Pt tolerated well.  RT will continue to monitor.    Ronny Flurry 01/07/2018, 11:34 AM

## 2018-01-07 NOTE — Progress Notes (Signed)
Patient ID: Ricky Graham, male   DOB: December 03, 1990, 27 y.o.   MRN: [id number] Postop day 1.  Still intubated but waking up throughout the night.  Surgical site looks excellent.  The Penrose drain was removed.  A dressing was applied.  No signs of infection.  Stable postop, work on weaning and extubation today.  May start oral liquids when he is extubated.

## 2018-01-07 NOTE — Progress Notes (Signed)
Pt placed on hold, awaiting availability of 4N ICU bed. Pt to remain intubated on sedation with neuro checks per orders. Pt's mother en route from [place], [place] where she lives.

## 2018-01-07 NOTE — Progress Notes (Addendum)
Patient doing reasonably well this morning Patient has multiple rib fractures Patient also has left sacral ala and right pubic rami fracture along with left clavicle fracture. No other orthopedic complaints this morning  Surgical treatment for the pelvic ring fractures per Ortho trauma team disposition tomorrow.  These may or may not need surgical intervention. I have recommended that he undergo fixation of the left clavicle fracture as well with the Ortho trauma team in order to facilitate a quicker return to function

## 2018-01-07 NOTE — Progress Notes (Signed)
Foley not placed in OR, will be  placed in PACU per Dr Aleene Davidson & Dr Darnelle Spangle to maintain strict I & O

## 2018-01-07 NOTE — Progress Notes (Signed)
Updated Jeramia's grandfather, Richardson Landry, via phone regarding room assignment on 4N. No change in status, pt to remain intubated at this time on sedation- same status as when Mr [place] had visited [place] in PACU earlier in the day.  Pt's grandfather had returned home to wait on pt's mother arrival. Jeanie Cooks mother due to arrive at 2300 at airport per pt's grandfather.

## 2018-01-07 NOTE — Progress Notes (Signed)
Follow up - Trauma Critical Care  Patient Details:    Ricky Graham is an 27 y.o. male.  Lines/tubes : Airway 7.5 mm (Active)  Secured at (cm) 29 cm 01/07/2018  8:00 AM  Measured From Nare 01/07/2018  8:00 AM  Secured Location Right 01/07/2018  8:00 AM  Secured By [employer] 01/07/2018  8:00 AM  Cuff Pressure (cm H2O) 24 cm H2O 01/06/2018  7:32 PM  Site Condition Dry;Cool 01/07/2018  8:00 AM     Open Drain 1 Left Other (Comment)  (Active)  Site Description Unable to view 01/06/2018  2:15 PM  Dressing Status Clean;Dry;Old drainage 01/06/2018  6:00 PM  Drainage Appearance Serosanguineous 01/06/2018  6:00 PM  Status Unclamped 01/06/2018  6:00 PM     Urethral Catheter Ciera Osipenko, RN Non-latex 16 Fr. (Active)  Indication for Insertion or Continuance of Catheter Unstable critical patients (first 24-48 hours) 01/07/2018  8:00 AM  Site Assessment Clean;Intact 01/06/2018  8:00 PM  Catheter Maintenance Bag below level of bladder;Catheter secured;Drainage bag/tubing not touching floor;Insertion date on drainage bag;No dependent loops;Seal intact 01/07/2018  8:00 AM  Collection Container Standard drainage bag 01/06/2018  8:00 PM  Securement Method Tape 01/06/2018  2:15 PM  Urinary Catheter Interventions Unclamped 01/06/2018  8:00 PM  Output (mL) 600 mL 01/07/2018  5:30 AM    Microbiology/Sepsis markers: Results for orders placed or performed during the hospital encounter of 01/06/18  MRSA PCR Screening     Status: None   Collection Time: 01/06/18  7:44 PM  Result Value Ref Range Status   MRSA by PCR NEGATIVE NEGATIVE Final    Comment:        The GeneXpert MRSA Assay (FDA approved for NASAL specimens only), is one component of a comprehensive MRSA colonization surveillance program. It is not intended to diagnose MRSA infection nor to guide or monitor treatment for MRSA infections. Performed at [facility], [address] [place], [place] [address]      Anti-infectives:  Anti-infectives (From admission, onward)   Start     Dose/Rate Route Frequency Ordered Stop   01/06/18 2200  clindamycin (CLEOCIN) capsule 300 mg  Status:  Discontinued     300 mg Oral Every 8 hours 01/06/18 1834 01/06/18 2143   01/06/18 2200  clindamycin (CLEOCIN) IVPB 300 mg     300 mg 100 mL/hr over 30 Minutes Intravenous Every 8 hours 01/06/18 2143     01/06/18 1530  clindamycin (CLEOCIN) IVPB 600 mg  Status:  Discontinued     600 mg 100 mL/hr over 30 Minutes Intravenous Every 8 hours 01/06/18 1514 01/06/18 1836   01/06/18 0415  ceFAZolin (ANCEF) IVPB 2g/100 mL premix     2 g 200 mL/hr over 30 Minutes Intravenous  Once 01/06/18 0402 01/06/18 0509      Best Practice/Protocols:  VTE Prophylaxis: Mechanical Continous Sedation  Consults: Treatment Team:  Serena Colonel, MD    Studies:    Events:  Subjective:    Overnight Issues:   Objective:  Vital signs for last 24 hours: Temp:  [97.7 F (36.5 C)-99.7 F (37.6 C)] 97.9 F (36.6 C) (10/13 0800) Pulse Rate:  [58-98] 79 (10/13 0800) Resp:  [6-28] 17 (10/13 0800) BP: (104-167)/(61-148) 131/98 (10/13 0800) SpO2:  [98 %-100 %] 100 % (10/13 0800) FiO2 (%):  [30 %-40 %] 30 % (10/13 0800)  Hemodynamic parameters for last 24 hours:    Intake/Output from previous day: 10/12 0701 - 10/13 0700 In: 3880.6 [I.V.:3630.6;  IV Piggyback:250] Out: 2355 [Urine:2300; Emesis/NG output:30; Blood:25]  Intake/Output this shift: Total I/O In: 114.7 [I.V.:114.7] Out: -   Vent settings for last 24 hours: Vent Mode: CPAP;PSV FiO2 (%):  [30 %-40 %] 30 % Set Rate:  [15 bmp-18 bmp] 15 bmp Vt Set:  [600 mL] 600 mL PEEP:  [5 cmH20] 5 cmH20 Pressure Support:  [5 cmH20] 5 cmH20 Plateau Pressure:  [13 cmH20-17 cmH20] 14 cmH20  Physical Exam:  General: alert and no respiratory distress Neuro: alert and F/C HEENT/Neck: NT ETT, MMF Resp: clear to auscultation bilaterally CVS: regular rate and rhythm, S1, S2  normal, no murmur, click, rub or gallop GI: soft, nontender, BS WNL, no r/g Extremities: mult abrasions  Results for orders placed or performed during the hospital encounter of 01/06/18 (from the past 24 hour(s))  HIV antibody (Routine Testing)     Status: None   Collection Time: 01/06/18  3:39 PM  Result Value Ref Range   HIV Screen 4th Generation wRfx Non Reactive Non Reactive  CBC     Status: Abnormal   Collection Time: 01/06/18  3:39 PM  Result Value Ref Range   WBC 18.5 (H) 4.0 - 10.5 K/uL   RBC 4.43 4.22 - 5.81 MIL/uL   Hemoglobin 12.6 (L) 13.0 - 17.0 g/dL   HCT 16.1 (L) 09.6 - 04.5 %   MCV 86.0 80.0 - 100.0 fL   MCH 28.4 26.0 - 34.0 pg   MCHC 33.1 30.0 - 36.0 g/dL   RDW 40.9 81.1 - 91.4 %   Platelets 231 150 - 400 K/uL   nRBC 0.0 0.0 - 0.2 %  Basic metabolic panel     Status: Abnormal   Collection Time: 01/06/18  3:39 PM  Result Value Ref Range   Sodium 136 135 - 145 mmol/L   Potassium 4.5 3.5 - 5.1 mmol/L   Chloride 108 98 - 111 mmol/L   CO2 22 22 - 32 mmol/L   Glucose, Bld 160 (H) 70 - 99 mg/dL   BUN 12 6 - 20 mg/dL   Creatinine, Ser 7.82 (H) 0.61 - 1.24 mg/dL   Calcium 8.0 (L) 8.9 - 10.3 mg/dL   GFR calc non Af Amer >60 >60 mL/min   GFR calc Af Amer >60 >60 mL/min   Anion gap 6 5 - 15  Triglycerides     Status: None   Collection Time: 01/06/18  3:39 PM  Result Value Ref Range   Triglycerides 62 <150 mg/dL  Blood gas, arterial     Status: Abnormal   Collection Time: 01/06/18  4:45 PM  Result Value Ref Range   FIO2 40.00    Delivery systems VENTILATOR    Mode PRESSURE REGULATED VOLUME CONTROL    VT 600 mL   LHR 18 resp/min   Peep/cpap 5.0 cm H20   pH, Arterial 7.441 7.350 - 7.450   pCO2 arterial 33.7 32.0 - 48.0 mmHg   pO2, Arterial 180 (H) 83.0 - 108.0 mmHg   Bicarbonate 22.7 20.0 - 28.0 mmol/L   Acid-base deficit 0.9 0.0 - 2.0 mmol/L   O2 Saturation 99.3 %   Patient temperature 98.1    Collection site RIGHT RADIAL    Drawn by ARTERIAL DRAW    Sample  type RIGHT RADIAL    Allens test (pass/fail) PASS PASS  MRSA PCR Screening     Status: None   Collection Time: 01/06/18  7:44 PM  Result Value Ref Range   MRSA by PCR NEGATIVE NEGATIVE  CBC  Status: Abnormal   Collection Time: 01/07/18  6:08 AM  Result Value Ref Range   WBC 14.5 (H) 4.0 - 10.5 K/uL   RBC 4.11 (L) 4.22 - 5.81 MIL/uL   Hemoglobin 11.3 (L) 13.0 - 17.0 g/dL   HCT 16.1 (L) 09.6 - 04.5 %   MCV 87.6 80.0 - 100.0 fL   MCH 27.5 26.0 - 34.0 pg   MCHC 31.4 30.0 - 36.0 g/dL   RDW 40.9 81.1 - 91.4 %   Platelets 204 150 - 400 K/uL   nRBC 0.0 0.0 - 0.2 %  Basic metabolic panel     Status: Abnormal   Collection Time: 01/07/18  6:08 AM  Result Value Ref Range   Sodium 138 135 - 145 mmol/L   Potassium 4.8 3.5 - 5.1 mmol/L   Chloride 106 98 - 111 mmol/L   CO2 24 22 - 32 mmol/L   Glucose, Bld 134 (H) 70 - 99 mg/dL   BUN 14 6 - 20 mg/dL   Creatinine, Ser 7.82 0.61 - 1.24 mg/dL   Calcium 8.3 (L) 8.9 - 10.3 mg/dL   GFR calc non Af Amer >60 >60 mL/min   GFR calc Af Amer >60 >60 mL/min   Anion gap 8 5 - 15    Assessment & Plan: Present on Admission: . Open mandibular fracture (HCC) . Trauma    LOS: 1 day   Additional comments:I reviewed the patient's new clinical lab test results. Marland Kitchen PHBC C7 transverse process fracture - cervical collar per Dr. Franky Macho Scalp and chest lacerations - I closed in ED Acute hypoxic ventilator dependent respiratory failure - weaning well, extubate now Left open mandible fracture - S/P ORIF and MMF by Dr. Pollyann Kennedy Right rib fracture 5, 6, left rib fracture 1-5 - Multimodal pain control and pulmonary toilet Pelvic ring fracture including left sacral ala and right pubic rami - Dr. August Saucer evaluated, Ortho Trauma team to see tomorrow ETOH 165 - CSW for SBIRT VTE - Lovenox FEN - clears if tol extub Dispo - ICU, I spoke with his parents Critical Care Total Time*: 45 Minutes  Violeta Gelinas, MD, MPH, FACS Trauma: [phone] General Surgery:  [phone]  01/07/2018  *Care during the described time interval was provided by me. I have reviewed this patient's available data, including medical history, events of note, physical examination and test results as part of my evaluation.  Patient ID: Ricky Graham, male   DOB: 1990/11/13, 27 y.o.   MRN: [id number]
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
## 2018-01-08 ENCOUNTER — Inpatient Hospital Stay (HOSPITAL_COMMUNITY): Payer: No Typology Code available for payment source

## 2018-01-08 LAB — BASIC METABOLIC PANEL
ANION GAP: 7 (ref 5–15)
BUN: 10 mg/dL (ref 6–20)
CALCIUM: 8.1 mg/dL — AB (ref 8.9–10.3)
CO2: 25 mmol/L (ref 22–32)
Chloride: 106 mmol/L (ref 98–111)
Creatinine, Ser: 0.9 mg/dL (ref 0.61–1.24)
GFR calc Af Amer: >60 mL/min (ref >60)
GLUCOSE: 95 mg/dL (ref 70–99)
Potassium: 4.2 mmol/L (ref 3.5–5.1)
Sodium: 138 mmol/L (ref 135–145)

## 2018-01-08 LAB — CBC
HCT: 30.1 % — ABNORMAL LOW (ref 39.0–52.0)
Hemoglobin: 9.2 g/dL — ABNORMAL LOW (ref 13.0–17.0)
MCH: 27.5 pg (ref 26.0–34.0)
MCHC: 30.6 g/dL (ref 30.0–36.0)
MCV: 89.9 fL (ref 80.0–100.0)
NRBC: 0 % (ref 0.0–0.2)
PLATELETS: 150 10*3/uL (ref 150–400)
RBC: 3.35 MIL/uL — ABNORMAL LOW (ref 4.22–5.81)
RDW: 14.1 % (ref 11.5–15.5)
WBC: 7.7 10*3/uL (ref 4.0–10.5)

## 2018-01-08 MED ORDER — METHOCARBAMOL 500 MG PO TABS
500.0000 mg | ORAL_TABLET | Freq: Three times a day (TID) | ORAL | Status: DC
  Administered 2018-01-08 – 2018-01-11 (×10): 500 mg via ORAL
  Filled 2018-01-08 (×12): qty 1

## 2018-01-08 MED ORDER — ACETAMINOPHEN 500 MG PO TABS
1000.0000 mg | ORAL_TABLET | Freq: Four times a day (QID) | ORAL | Status: DC
  Administered 2018-01-08 – 2018-01-17 (×33): 1000 mg via ORAL
  Filled 2018-01-08 (×35): qty 2

## 2018-01-08 MED ORDER — KETOROLAC TROMETHAMINE 15 MG/ML IJ SOLN
15.0000 mg | Freq: Three times a day (TID) | INTRAMUSCULAR | Status: DC | PRN
  Administered 2018-01-08 – 2018-01-10 (×5): 15 mg via INTRAVENOUS
  Filled 2018-01-08 (×5): qty 1
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
## 2018-01-08 NOTE — Progress Notes (Signed)
PT Cancellation Note  Patient Details Name: Ricky Graham MRN: [id number] DOB: [birth year]   Cancelled Treatment:    Reason Eval/Treat Not Completed: Patient not medically ready. Pt going to OR today for pelvic fracture and clavicle fracture. PT to return as able as appropriate.  Lewis Shock, PT, DPT Acute Rehabilitation Services Pager #: [phone] Office #: [phone]    Iona Hansen 01/08/2018, 8:29 AM

## 2018-01-08 NOTE — Progress Notes (Signed)
At shift report at patient bedside, there was noted to be 6 family members at patients bedside. This nurse explained to family at bedside that we have a 2 visitor at a time policy and explained that in order for me to properly care for patient and for patient safety we can only allow 2 visitors at a time at patients bedside. I also explained that family can rotate as much as they want through out the night as long as the patient is ok with it. A family member voiced concern that he felt like I was be insensitive because they all want to support the patient. I acknowledged family members feelings and explained plan of care per patients approval. Family member also states understanding and states compliance with visitors policy.

## 2018-01-08 NOTE — Progress Notes (Signed)
2 Days Post-Op   Subjective/Chief Complaint: C/o pain otherwise no issues   Objective: Vital signs in last 24 hours: Temp:  [98.3 F (36.8 C)-99.8 F (37.7 C)] 99.8 F (37.7 C) (10/14 0800) Pulse Rate:  [80-105] 88 (10/14 0837) Resp:  [10-20] 15 (10/14 0837) BP: (100-175)/(69-92) 131/81 (10/14 0800) SpO2:  [99 %-100 %] 100 % (10/14 0837) Last BM Date: (PTA)  Intake/Output from previous day: 10/13 0701 - 10/14 0700 In: 2312.3 [I.V.:2155.5; IV Piggyback:156.8] Out: 1775 [Urine:1775] Intake/Output this shift: Total I/O In: 200 [I.V.:156.8; IV Piggyback:43.2] Out: -   Physical Exam:  General: alert no distress Neuro: alert and F/C HEENT/Neck:  MMF Resp: cta bilaterally CVS: regular rate and rhythm, S1, S2 normal GI: soft, nontender, pos bs Extremities: mult abrasions  Lab Results:  Recent Labs    01/07/18 0608 01/08/18 0447  WBC 14.5* 7.7  HGB 11.3* 9.2*  HCT 36.0* 30.1*  PLT 204 150   BMET Recent Labs    01/07/18 0608 01/08/18 0447  NA 138 138  K 4.8 4.2  CL 106 106  CO2 24 25  GLUCOSE 134* 95  BUN 14 10  CREATININE 0.96 0.90  CALCIUM 8.3* 8.1*   PT/INR Recent Labs    01/06/18 0401  LABPROT 15.1  INR 1.20   ABG Recent Labs    01/06/18 1645  PHART 7.441  HCO3 22.7    Studies/Results: Dg Chest Port 1 View  Result Date: 01/08/2018 CLINICAL DATA:  Follow-up endotracheal tube EXAM: PORTABLE CHEST 1 VIEW COMPARISON:  01/07/2018 FINDINGS: Cardiac shadow is stable. Left clavicular fracture is again noted. Posterior left rib fractures are noted. No definitive pneumothorax is seen. Small left pleural effusion is noted. Endotracheal tube has been removed. IMPRESSION: Post traumatic bony changes as described. No acute infiltrate is seen. Electronically Signed   By: Alcide Clever M.D.   On: 01/08/2018 07:46   Dg Chest Port 1 View  Result Date: 01/07/2018 CLINICAL DATA:  Endotracheal tube EXAM: PORTABLE CHEST 1 VIEW COMPARISON:  Chest x-ray dated  01/06/2018. FINDINGS: Endotracheal tube is adequately positioned with tip at the level of the clavicles, approximately 5 cm above the carina. Heart size and mediastinal contours are within normal limits. Patchy opacities at the LEFT lung apex correspond to the LEFT upper lobe contusion seen on yesterday's chest CT. The trace pneumothorax described on the chest CT is not seen on today's chest x-ray. RIGHT lung is clear. Again noted is the displaced fracture of the LEFT clavicle. Again noted are the displaced fractures of the LEFT second through fifth ribs. IMPRESSION: 1. Endotracheal tube in place with tip at the level of the clavicles, approximately 5 cm above the carina. 2. LEFT upper lobe opacity, corresponding to the lung contusion demonstrated on yesterday's chest CT. Lungs appear otherwise clear. No pneumothorax seen. 3. Displaced fracture of the LEFT clavicle. 4. Displaced fractures of the LEFT second through fifth ribs. Electronically Signed   By: Bary Richard M.D.   On: 01/07/2018 10:31    Anti-infectives: Anti-infectives (From admission, onward)   Start     Dose/Rate Route Frequency Ordered Stop   01/06/18 2200  clindamycin (CLEOCIN) capsule 300 mg  Status:  Discontinued     300 mg Oral Every 8 hours 01/06/18 1834 01/06/18 2143   01/06/18 2200  clindamycin (CLEOCIN) IVPB 300 mg     300 mg 100 mL/hr over 30 Minutes Intravenous Every 8 hours 01/06/18 2143     01/06/18 1530  clindamycin (CLEOCIN) IVPB 600  mg  Status:  Discontinued     600 mg 100 mL/hr over 30 Minutes Intravenous Every 8 hours 01/06/18 1514 01/06/18 1836   01/06/18 0415  ceFAZolin (ANCEF) IVPB 2g/100 mL premix     2 g 200 mL/hr over 30 Minutes Intravenous  Once 01/06/18 0402 01/06/18 0509      Assessment/Plan: PHBC C7 transverse process fracture-cervical collar per Dr. Franky Macho Scalp and chest lacerations- closed Acute hypoxic ventilator dependent respiratory failure - resolved, extubated Left open mandible  fracture- S/P ORIF and MMF by Dr. Pollyann Kennedy Right rib fracture 5, 6, left rib fracture 1-5- Multimodal pain control and pulmonary toilet Pelvic ring fracture including left sacral ala and right pubic rami-ortho trauma to see today Left clavicle fx- or likely tomorrow, npo after mn ETOH 165 - CSW for SBIRT VTE - Lovenox FEN - liquids per ent Dispo - tx to 4np Critical Care Total Time*: 30 Minutes  Emelia Loron 01/08/2018
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
## 2018-01-09 ENCOUNTER — Inpatient Hospital Stay (HOSPITAL_COMMUNITY): Payer: No Typology Code available for payment source | Admitting: Anesthesiology

## 2018-01-09 ENCOUNTER — Encounter (HOSPITAL_COMMUNITY): Admission: EM | Disposition: A | Discharge status: Home Health | Payer: Self-pay | Source: Home / Self Care

## 2018-01-09 ENCOUNTER — Inpatient Hospital Stay (HOSPITAL_COMMUNITY): Payer: No Typology Code available for payment source

## 2018-01-09 ENCOUNTER — Encounter (HOSPITAL_COMMUNITY): Payer: Self-pay | Admitting: Otolaryngology

## 2018-01-09 HISTORY — PX: ORIF CLAVICULAR FRACTURE: SHX5055

## 2018-01-09 LAB — BASIC METABOLIC PANEL
ANION GAP: 8 (ref 5–15)
BUN: 8 mg/dL (ref 6–20)
CHLORIDE: 104 mmol/L (ref 98–111)
CO2: 26 mmol/L (ref 22–32)
Calcium: 8.4 mg/dL — ABNORMAL LOW (ref 8.9–10.3)
Creatinine, Ser: 0.86 mg/dL (ref 0.61–1.24)
GFR calc non Af Amer: >60 mL/min (ref >60)
GLUCOSE: 91 mg/dL (ref 70–99)
POTASSIUM: 4.3 mmol/L (ref 3.5–5.1)
Sodium: 138 mmol/L (ref 135–145)

## 2018-01-09 LAB — TRIGLYCERIDES: Triglycerides: 150 mg/dL — ABNORMAL HIGH (ref <150)

## 2018-01-09 SURGERY — OPEN REDUCTION INTERNAL FIXATION (ORIF) CLAVICULAR FRACTURE
Anesthesia: General | Site: Shoulder | Laterality: Left

## 2018-01-09 MED ORDER — OXYCODONE HCL 5 MG/5ML PO SOLN
5.0000 mg | ORAL | Status: DC | PRN
  Administered 2018-01-09 – 2018-01-10 (×4): 10 mg via ORAL
  Filled 2018-01-09 (×2): qty 10
  Filled 2018-01-09: qty 5
  Filled 2018-01-09 (×2): qty 10

## 2018-01-09 MED ORDER — LACTATED RINGERS IV SOLN: INTRAVENOUS | Status: DC

## 2018-01-09 MED ORDER — FENTANYL CITRATE (PF) 250 MCG/5ML IJ SOLN
INTRAMUSCULAR | Status: DC | PRN
  Administered 2018-01-09 (×5): 50 ug via INTRAVENOUS

## 2018-01-09 MED ORDER — PROPOFOL 10 MG/ML IV BOLUS
INTRAVENOUS | Status: AC
  Filled 2018-01-09: qty 40

## 2018-01-09 MED ORDER — SUCCINYLCHOLINE CHLORIDE 200 MG/10ML IV SOSY
PREFILLED_SYRINGE | INTRAVENOUS | Status: AC
  Filled 2018-01-09: qty 10

## 2018-01-09 MED ORDER — MIDAZOLAM HCL 2 MG/2ML IJ SOLN
INTRAMUSCULAR | Status: AC
  Filled 2018-01-09: qty 2

## 2018-01-09 MED ORDER — SUGAMMADEX SODIUM 200 MG/2ML IV SOLN
INTRAVENOUS | Status: DC | PRN
  Administered 2018-01-09: 163.2 mg via INTRAVENOUS

## 2018-01-09 MED ORDER — DEXMEDETOMIDINE HCL IN NACL 200 MCG/50ML IV SOLN
INTRAVENOUS | Status: AC
  Filled 2018-01-09: qty 50

## 2018-01-09 MED ORDER — BUPIVACAINE LIPOSOME 1.3 % IJ SUSP
20.0000 mL | INTRAMUSCULAR | Status: AC
  Administered 2018-01-09: 20 mL
  Filled 2018-01-09: qty 20

## 2018-01-09 MED ORDER — BUPIVACAINE-EPINEPHRINE 0.5% -1:200000 IJ SOLN
INTRAMUSCULAR | Status: AC
  Filled 2018-01-09: qty 1

## 2018-01-09 MED ORDER — DEXAMETHASONE SODIUM PHOSPHATE 10 MG/ML IJ SOLN
INTRAMUSCULAR | Status: AC
  Filled 2018-01-09: qty 1

## 2018-01-09 MED ORDER — LIDOCAINE 2% (20 MG/ML) 5 ML SYRINGE
INTRAMUSCULAR | Status: AC
  Filled 2018-01-09: qty 5

## 2018-01-09 MED ORDER — PHENYLEPHRINE 40 MCG/ML (10ML) SYRINGE FOR IV PUSH (FOR BLOOD PRESSURE SUPPORT)
PREFILLED_SYRINGE | INTRAVENOUS | Status: AC
  Filled 2018-01-09: qty 10

## 2018-01-09 MED ORDER — ONDANSETRON HCL 4 MG/2ML IJ SOLN
INTRAMUSCULAR | Status: AC
  Filled 2018-01-09: qty 2

## 2018-01-09 MED ORDER — CEFAZOLIN SODIUM-DEXTROSE 1-4 GM/50ML-% IV SOLN
1.0000 g | Freq: Three times a day (TID) | INTRAVENOUS | Status: AC
  Administered 2018-01-09 – 2018-01-10 (×3): 1 g via INTRAVENOUS
  Filled 2018-01-09 (×3): qty 50

## 2018-01-09 MED ORDER — FAMOTIDINE 40 MG/5ML PO SUSR
20.0000 mg | Freq: Two times a day (BID) | ORAL | Status: DC
  Administered 2018-01-09 – 2018-01-17 (×16): 20 mg via ORAL
  Filled 2018-01-09 (×16): qty 2.5

## 2018-01-09 MED ORDER — ONDANSETRON HCL 4 MG/2ML IJ SOLN
INTRAMUSCULAR | Status: DC | PRN
  Administered 2018-01-09: 4 mg via INTRAVENOUS

## 2018-01-09 MED ORDER — MEPERIDINE HCL 50 MG/ML IJ SOLN: 6.2500 mg | INTRAMUSCULAR | Status: DC | PRN

## 2018-01-09 MED ORDER — MIDAZOLAM HCL 5 MG/5ML IJ SOLN
INTRAMUSCULAR | Status: DC | PRN
  Administered 2018-01-09: 2 mg via INTRAVENOUS

## 2018-01-09 MED ORDER — FENTANYL CITRATE (PF) 250 MCG/5ML IJ SOLN
INTRAMUSCULAR | Status: AC
  Filled 2018-01-09: qty 5

## 2018-01-09 MED ORDER — ROCURONIUM BROMIDE 50 MG/5ML IV SOSY
PREFILLED_SYRINGE | INTRAVENOUS | Status: AC
  Filled 2018-01-09: qty 5

## 2018-01-09 MED ORDER — LIDOCAINE 2% (20 MG/ML) 5 ML SYRINGE
INTRAMUSCULAR | Status: DC | PRN
  Administered 2018-01-09: 50 mg via INTRAVENOUS

## 2018-01-09 MED ORDER — CEFAZOLIN SODIUM-DEXTROSE 2-3 GM-%(50ML) IV SOLR
INTRAVENOUS | Status: DC | PRN
  Administered 2018-01-09: 2 g via INTRAVENOUS

## 2018-01-09 MED ORDER — DEXAMETHASONE SODIUM PHOSPHATE 10 MG/ML IJ SOLN
INTRAMUSCULAR | Status: DC | PRN
  Administered 2018-01-09: 10 mg via INTRAVENOUS

## 2018-01-09 MED ORDER — PROMETHAZINE HCL 25 MG/ML IJ SOLN: 6.2500 mg | INTRAMUSCULAR | Status: DC | PRN

## 2018-01-09 MED ORDER — LACTATED RINGERS IV SOLN
INTRAVENOUS | Status: DC | PRN
  Administered 2018-01-09 (×2): via INTRAVENOUS

## 2018-01-09 MED ORDER — BACITRACIN ZINC 500 UNIT/GM EX OINT
TOPICAL_OINTMENT | Freq: Two times a day (BID) | CUTANEOUS | Status: AC
  Administered 2018-01-09 – 2018-01-10 (×3): via TOPICAL
  Filled 2018-01-09: qty 28.4

## 2018-01-09 MED ORDER — PROPOFOL 10 MG/ML IV BOLUS
INTRAVENOUS | Status: DC | PRN
  Administered 2018-01-09: 200 mg via INTRAVENOUS

## 2018-01-09 MED ORDER — ROCURONIUM BROMIDE 10 MG/ML (PF) SYRINGE
PREFILLED_SYRINGE | INTRAVENOUS | Status: DC | PRN
  Administered 2018-01-09: 50 mg via INTRAVENOUS
  Administered 2018-01-09: 20 mg via INTRAVENOUS

## 2018-01-09 MED ORDER — HYDROMORPHONE HCL 1 MG/ML IJ SOLN: 0.2500 mg | INTRAMUSCULAR | Status: DC | PRN

## 2018-01-09 MED ORDER — PHENOL 1.4 % MT LIQD
1.0000 | OROMUCOSAL | Status: DC | PRN
  Administered 2018-01-13: 1 via OROMUCOSAL
  Filled 2018-01-09: qty 177

## 2018-01-09 MED ORDER — 0.9 % SODIUM CHLORIDE (POUR BTL) OPTIME
TOPICAL | Status: DC | PRN
  Administered 2018-01-09: 1000 mL

## 2018-01-09 MED ORDER — DEXMEDETOMIDINE HCL IN NACL 200 MCG/50ML IV SOLN
INTRAVENOUS | Status: DC | PRN
  Administered 2018-01-09 (×2): 8 ug via INTRAVENOUS

## 2018-01-09 SURGICAL SUPPLY — 60 items
BIT DRILL 2.8 QUICK RELEASE (BIT) IMPLANT
BNDG COHESIVE 4X5 TAN STRL (GAUZE/BANDAGES/DRESSINGS) IMPLANT
BRUSH SCRUB SURG 4.25 DISP (MISCELLANEOUS) ×6 IMPLANT
CANISTER SUCTION WELLS/JOHNSON (MISCELLANEOUS) ×2 IMPLANT
CLOSURE WOUND 1/2 X4 (GAUZE/BANDAGES/DRESSINGS) ×1
COVER SURGICAL LIGHT HANDLE (MISCELLANEOUS) ×6 IMPLANT
COVER WAND RF STERILE (DRAPES) ×3 IMPLANT
DRAPE C-ARM 42X72 X-RAY (DRAPES) ×3 IMPLANT
DRAPE INCISE IOBAN 66X45 STRL (DRAPES) ×3 IMPLANT
DRAPE LAPAROTOMY TRNSV 102X78 (DRAPE) ×1 IMPLANT
DRAPE ORTHO SPLIT 77X108 STRL (DRAPES) ×6
DRAPE SURG ORHT 6 SPLT 77X108 (DRAPES) IMPLANT
DRAPE U-SHAPE 47X51 STRL (DRAPES) ×6 IMPLANT
DRILL 2.8 QUICK RELEASE (BIT) ×3
DRSG MEPILEX BORDER 4X8 (GAUZE/BANDAGES/DRESSINGS) ×3 IMPLANT
ELECT REM PT RETURN 9FT ADLT (ELECTROSURGICAL) ×3
ELECTRODE REM PT RTRN 9FT ADLT (ELECTROSURGICAL) ×1 IMPLANT
GLOVE BIO SURGEON STRL SZ 6 (GLOVE) ×4 IMPLANT
GLOVE BIO SURGEON STRL SZ 6.5 (GLOVE) ×1 IMPLANT
GLOVE BIO SURGEON STRL SZ7.5 (GLOVE) ×3 IMPLANT
GLOVE BIO SURGEON STRL SZ8 (GLOVE) ×3 IMPLANT
GLOVE BIO SURGEONS STRL SZ 6.5 (GLOVE) ×1
GLOVE BIOGEL PI IND STRL 6 (GLOVE) IMPLANT
GLOVE BIOGEL PI IND STRL 7.5 (GLOVE) ×1 IMPLANT
GLOVE BIOGEL PI IND STRL 8 (GLOVE) ×1 IMPLANT
GLOVE BIOGEL PI INDICATOR 6 (GLOVE) ×4
GLOVE BIOGEL PI INDICATOR 7.5 (GLOVE) ×2
GLOVE BIOGEL PI INDICATOR 8 (GLOVE) ×2
GOWN STRL REUS W/ TWL LRG LVL3 (GOWN DISPOSABLE) ×2 IMPLANT
GOWN STRL REUS W/ TWL XL LVL3 (GOWN DISPOSABLE) ×1 IMPLANT
GOWN STRL REUS W/TWL LRG LVL3 (GOWN DISPOSABLE) ×6
GOWN STRL REUS W/TWL XL LVL3 (GOWN DISPOSABLE) ×3
KIT BASIN OR (CUSTOM PROCEDURE TRAY) ×3 IMPLANT
KIT TURNOVER KIT B (KITS) ×3 IMPLANT
MANIFOLD NEPTUNE II (INSTRUMENTS) ×1 IMPLANT
NDL HYPO 25GX1X1/2 BEV (NEEDLE) IMPLANT
NEEDLE HYPO 25GX1X1/2 BEV (NEEDLE) ×3 IMPLANT
NS IRRIG 1000ML POUR BTL (IV SOLUTION) ×3 IMPLANT
PACK GENERAL/GYN (CUSTOM PROCEDURE TRAY) ×3 IMPLANT
PAD ARMBOARD 7.5X6 YLW CONV (MISCELLANEOUS) ×6 IMPLANT
PLATE LOCKING 8H LEFT (Plate) ×2 IMPLANT
SCREW HEXALOBE NON-LOCK 3.5X14 (Screw) ×2 IMPLANT
SCREW HEXALOBE NON-LOCK 3.5X16 (Screw) ×10 IMPLANT
SLING ARM IMMOBILIZER LRG (SOFTGOODS) IMPLANT
SLING ARM IMMOBILIZER MED (SOFTGOODS) IMPLANT
SLING ARM IMMOBILIZER XL (CAST SUPPLIES) ×2 IMPLANT
STAPLER VISISTAT 35W (STAPLE) ×3 IMPLANT
STOCKINETTE IMPERVIOUS 9X36 MD (GAUZE/BANDAGES/DRESSINGS) IMPLANT
STRIP CLOSURE SKIN 1/2X4 (GAUZE/BANDAGES/DRESSINGS) ×2 IMPLANT
SUCTION FRAZIER HANDLE 10FR (MISCELLANEOUS) ×2
SUCTION TUBE FRAZIER 10FR DISP (MISCELLANEOUS) ×1 IMPLANT
SUT MNCRL AB 3-0 PS2 27 (SUTURE) ×3 IMPLANT
SUT VIC AB 0 CT1 27 (SUTURE) ×3
SUT VIC AB 0 CT1 27XBRD ANBCTR (SUTURE) ×1 IMPLANT
SUT VIC AB 2-0 CT1 27 (SUTURE) ×3
SUT VIC AB 2-0 CT1 TAPERPNT 27 (SUTURE) ×1 IMPLANT
SYR CONTROL 10ML LL (SYRINGE) ×2 IMPLANT
TOWEL OR 17X24 6PK STRL BLUE (TOWEL DISPOSABLE) ×4 IMPLANT
TOWEL OR 17X26 10 PK STRL BLUE (TOWEL DISPOSABLE) ×3 IMPLANT
WATER STERILE IRR 1000ML POUR (IV SOLUTION) ×1 IMPLANT

## 2018-01-09 NOTE — Progress Notes (Signed)
Physician came and updated patient on the status of surgery.
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
## 2018-01-09 NOTE — Anesthesia Postprocedure Evaluation (Signed)
Anesthesia Post Note  Patient: Ricky Graham  Procedure(s) Performed: OPEN REDUCTION INTERNAL FIXATION (ORIF) CLAVICULAR FRACTURE (Left Shoulder)     Patient location during evaluation: PACU Anesthesia Type: General Level of consciousness: awake and alert Pain management: pain level controlled Vital Signs Assessment: post-procedure vital signs reviewed and stable Respiratory status: spontaneous breathing, nonlabored ventilation, respiratory function stable and patient connected to nasal cannula oxygen Cardiovascular status: blood pressure returned to baseline and stable Postop Assessment: no apparent nausea or vomiting Anesthetic complications: no    Last Vitals:  Vitals:   01/09/18 1115 01/09/18 1122  BP: (!) 168/80 (!) 148/82  Pulse: 84 86  Resp: 12 13  Temp:  36.5 C  SpO2: 100% 100%    Last Pain:  Vitals:   01/09/18 1115  TempSrc:   PainSc: Asleep                 Shelton Silvas

## 2018-01-09 NOTE — Transfer of Care (Signed)
Immediate Anesthesia Transfer of Care Note  Patient: Ricky Graham  Procedure(s) Performed: OPEN REDUCTION INTERNAL FIXATION (ORIF) CLAVICULAR FRACTURE (Left Shoulder)  Patient Location: PACU  Anesthesia Type:General  Level of Consciousness: awake, drowsy and patient cooperative  Airway & Oxygen Therapy: Patient Spontanous Breathing and Patient connected to face mask oxygen  Post-op Assessment: Report given to RN and Post -op Vital signs reviewed and stable  Post vital signs: Reviewed and stable  Last Vitals:  Vitals Value Taken Time  BP 153/77 01/09/2018 10:30 AM  Temp    Pulse 100 01/09/2018 10:32 AM  Resp 15 01/09/2018 10:32 AM  SpO2 100 % 01/09/2018 10:32 AM  Vitals shown include unvalidated device data.  Last Pain:  Vitals:   01/09/18 0436  TempSrc:   PainSc: Asleep      Patients Stated Pain Goal: 0 (01/08/18 1031)  Complications: No apparent anesthesia complications

## 2018-01-09 NOTE — Anesthesia Procedure Notes (Signed)
Procedure Name: Intubation Date/Time: 01/09/2018 8:17 AM Performed by: Adria Dill, CRNA Pre-anesthesia Checklist: Patient identified, Emergency Drugs available, Suction available and Patient being monitored Patient Re-evaluated:Patient Re-evaluated prior to induction Oxygen Delivery Method: Circle system utilized Preoxygenation: Pre-oxygenation with 100% oxygen Induction Type: IV induction and Rapid sequence Laryngoscope Size: Glidescope and 4 Grade View: Grade I Tube type: Oral Tube size: 8.0 mm Number of attempts: 1 Airway Equipment and Method: Rigid stylet Placement Confirmation: ETT inserted through vocal cords under direct vision,  positive ETCO2,  CO2 detector and breath sounds checked- equal and bilateral Secured at: 22 cm Tube secured with: Tape Dental Injury: Teeth and Oropharynx as per pre-operative assessment

## 2018-01-09 NOTE — Progress Notes (Signed)
Pt on the unit from surgery

## 2018-01-09 NOTE — Progress Notes (Signed)
PHARMACIST - PHYSICIAN COMMUNICATION  DR:   Janee Morn  CONCERNING: IV to Oral Route Change Policy  RECOMMENDATION: This patient is receiving Famotidine by the intravenous route.  Based on criteria approved by the Pharmacy and Therapeutics Committee, the intravenous medication(s) is/are being converted to the equivalent oral dose form(s).   DESCRIPTION: These criteria include:  The patient is eating (either orally or via tube) and/or has been taking other orally administered medications for a least 24 hours  The patient has no evidence of active gastrointestinal bleeding or impaired GI absorption (gastrectomy, short bowel, patient on TNA or NPO).  If you have questions about this conversion, please contact the Pharmacy Department  []   [phone] )  [facility] []   [phone] )  [facility] [x]   [phone] )  [facility] []   [phone] )  [facility] []   [phone] )  [facility]   Tera Mater, [facility] 01/09/2018 1:25 PM

## 2018-01-09 NOTE — Anesthesia Preprocedure Evaluation (Addendum)
Anesthesia Evaluation  Patient identified by MRN, date of birth, ID band Patient awake    Reviewed: Allergy & Precautions, NPO status , Patient's Chart, lab work & pertinent test results  Airway Mallampati: III   Neck ROM: Limited    Dental  (+) Dental Advisory Given, Chipped,    Pulmonary neg pulmonary ROS,     + decreased breath sounds      Cardiovascular negative cardio ROS   Rhythm:Regular Rate:Normal     Neuro/Psych negative neurological ROS     GI/Hepatic negative GI ROS, Neg liver ROS,   Endo/Other  negative endocrine ROS  Renal/GU negative Renal ROS     Musculoskeletal negative musculoskeletal ROS (+)   Abdominal Normal abdominal exam  (+)   Peds  Hematology negative hematology ROS (+)   Anesthesia Other Findings   Reproductive/Obstetrics                           Anesthesia Physical Anesthesia Plan  ASA: III  Anesthesia Plan: General   Post-op Pain Management:    Induction: Intravenous  PONV Risk Score and Plan: 3 and Ondansetron, Midazolam and Dexamethasone  Airway Management Planned: Oral ETT and Video Laryngoscope Planned  Additional Equipment: None  Intra-op Plan:   Post-operative Plan: Extubation in OR  Informed Consent: I have reviewed the patients History and Physical, chart, labs and discussed the procedure including the risks, benefits and alternatives for the proposed anesthesia with the patient or authorized representative who has indicated his/her understanding and acceptance.   Dental advisory given  Plan Discussed with: CRNA  Anesthesia Plan Comments:        Anesthesia Quick Evaluation
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
## 2018-01-10 ENCOUNTER — Encounter (HOSPITAL_COMMUNITY): Payer: Self-pay | Admitting: Orthopedic Surgery

## 2018-01-10 MED ORDER — HYDROMORPHONE HCL 1 MG/ML IJ SOLN
1.0000 mg | INTRAMUSCULAR | Status: DC | PRN
  Administered 2018-01-11 – 2018-01-12 (×6): 1 mg via INTRAVENOUS
  Filled 2018-01-10 (×6): qty 1

## 2018-01-10 MED ORDER — DOCUSATE SODIUM 100 MG PO CAPS
100.0000 mg | ORAL_CAPSULE | Freq: Two times a day (BID) | ORAL | Status: DC
  Administered 2018-01-10 – 2018-01-15 (×11): 100 mg via ORAL
  Filled 2018-01-10 (×13): qty 1

## 2018-01-10 MED ORDER — OXYCODONE HCL 5 MG/5ML PO SOLN
5.0000 mg | ORAL | Status: DC | PRN
  Administered 2018-01-10: 5 mg via ORAL
  Administered 2018-01-10 – 2018-01-13 (×12): 10 mg via ORAL
  Filled 2018-01-10 (×12): qty 10

## 2018-01-10 MED ORDER — POLYETHYLENE GLYCOL 3350 17 G PO PACK: 17.0000 g | PACK | Freq: Every day | ORAL | Status: DC | PRN

## 2018-01-10 MED ORDER — KETOROLAC TROMETHAMINE 15 MG/ML IJ SOLN
15.0000 mg | Freq: Three times a day (TID) | INTRAMUSCULAR | Status: AC
  Administered 2018-01-10 – 2018-01-14 (×11): 15 mg via INTRAVENOUS
  Filled 2018-01-10 (×12): qty 1
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
## 2018-01-10 NOTE — Progress Notes (Signed)
Patient ID: Ricky Graham, male   DOB: January 06, 1991, 27 y.o.   MRN: [id number]  Doing well, eating soft food, occlusion seems to be intact.  Intraoral closure intact.  External incision intact.  No swelling or infection.  Recommend we continue with elastics.  I will order them up.  We will show the patient had a reapply them as needed.  Recommend suture removal on Friday.  Recommend follow-up with me as an outpatient in 3 weeks.
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
## 2018-01-10 NOTE — Progress Notes (Signed)
   01/10/18 1200  Clinical Encounter Type  Visited With Patient and family together  Visit Type Follow-up  Referral From Other (Comment)  Spiritual Encounters  Spiritual Needs Emotional  Stress Factors  Patient Stress Factors Exhausted  Family Stress Factors Exhausted;Major life changes   Followed up on initial visit in the ED. PT was alert and family was at bedside. PT was thankful and remembered my first visit. I offered spiritual support with ministry of presence and words of encouragement to both PT and family. Floor Chaplain available as needed.

## 2018-01-10 NOTE — Progress Notes (Signed)
[facility] (OTS)  1 Day Post-Op Procedure(s) (LRB): OPEN REDUCTION INTERNAL FIXATION (ORIF) CLAVICULAR FRACTURE (Left)  Subjective: Patient reports pain as improved left clavicle since surgery.    Objective: Current Vitals Blood pressure (!) 143/88, pulse 83, temperature 98.3 F (36.8 C), temperature source Oral, resp. rate 19, height 6' (1.829 m), weight 84.8 kg, SpO2 95 %. Vital signs in last 24 hours: Temp:  [97.7 F (36.5 C)-98.8 F (37.1 C)] 98.3 F (36.8 C) (10/16 0739) Pulse Rate:  [73-101] 83 (10/16 0739) Resp:  [12-19] 19 (10/16 0739) BP: (136-168)/(73-90) 143/88 (10/16 0739) SpO2:  [92 %-100 %] 95 % (10/16 0739) Weight:  [84.8 kg] 84.8 kg (10/15 1200)  Intake/Output from previous day: 10/15 0701 - 10/16 0700 In: 3340.3 [P.O.:480; I.V.:2535.7; IV Piggyback:324.6] Out: 1350 [Urine:1300; Blood:50]  LABS Recent Labs    01/08/18 0447  HGB 9.2*   Recent Labs    01/08/18 0447  WBC 7.7  RBC 3.35*  HCT 30.1*  PLT 150   Recent Labs    01/08/18 0447 01/09/18 0354  NA 138 138  K 4.2 4.3  CL 106 104  CO2 25 26  BUN 10 8  CREATININE 0.90 0.86  GLUCOSE 95 91  CALCIUM 8.1* 8.4*   No results for input(s): LABPT, INR in the last 72 hours.   Physical Exam LUE  Sens  Ax/R/M/U intact  Mot   Ax/ R/ PIN/ M/ AIN/ U intact  Brisk CR  Assessment/Plan: 1 Day Post-Op Procedure(s) (LRB): OPEN REDUCTION INTERNAL FIXATION (ORIF) CLAVICULAR FRACTURE (Left) 1. PT/OT WBAT lower and upper with motion as tolerated actively and passively 2. DVT proph Lovenox 3. F/u 8-14 days  Myrene Galas, MD [facility] [phone] (p)

## 2018-01-10 NOTE — Progress Notes (Signed)
1 Day Post-Op   Subjective/Chief Complaint: Complains of face pain, chest pain No SOB   Objective: Vital signs in last 24 hours: Temp:  [97.7 F (36.5 C)-98.8 F (37.1 C)] 98.3 F (36.8 C) (10/16 0739) Pulse Rate:  [73-101] 83 (10/16 0739) Resp:  [12-19] 19 (10/16 0739) BP: (136-168)/(73-90) 143/88 (10/16 0739) SpO2:  [92 %-100 %] 95 % (10/16 0739) Weight:  [84.8 kg] 84.8 kg (10/15 1200) Last BM Date: (PTA)  Intake/Output from previous day: 10/15 0701 - 10/16 0700 In: 3340.3 [P.O.:480; I.V.:2535.7; IV Piggyback:324.6] Out: 1350 [Urine:1300; Blood:50] Intake/Output this shift: No intake/output data recorded.  Exam: Lungs clear CV RRR c-collar in place Abdomen soft, NT  Lab Results:  Recent Labs    01/08/18 0447  WBC 7.7  HGB 9.2*  HCT 30.1*  PLT 150   BMET Recent Labs    01/08/18 0447 01/09/18 0354  NA 138 138  K 4.2 4.3  CL 106 104  CO2 25 26  GLUCOSE 95 91  BUN 10 8  CREATININE 0.90 0.86  CALCIUM 8.1* 8.4*   PT/INR No results for input(s): LABPROT, INR in the last 72 hours. ABG No results for input(s): PHART, HCO3 in the last 72 hours.  Invalid input(s): PCO2, PO2  Studies/Results: Dg Clavicle Left  Result Date: 01/09/2018 CLINICAL DATA:  Left clavicle fracture ORIF EXAM: LEFT CLAVICLE - 2+ VIEWS COMPARISON:  Fluoroscopy from earlier today FINDINGS: Anatomic alignment of the mid clavicle fracture with plating. Posterior left rib fractures, known from previous imaging. Normal AC joint alignment. IMPRESSION: Left mid clavicle fracture ORIF.  No complicating features. Electronically Signed   By: Marnee Spring M.D.   On: 01/09/2018 11:47   Dg Clavicle Left  Result Date: 01/09/2018 CLINICAL DATA:  ORIF left clavicle. EXAM: DG C-ARM 61-120 MIN; LEFT CLAVICLE - 2+ VIEWS COMPARISON:  01/08/2018. FINDINGS: Plate and screw fixation left clavicle. Hardware intact. Anatomic alignment. IMPRESSION: Plate and screw fixation left clavicle with anatomic  alignment. Electronically Signed   By: Maisie Fus  Register   On: 01/09/2018 09:59   Dg Clavicle Left  Result Date: 01/08/2018 CLINICAL DATA:  Fracture EXAM: LEFT CLAVICLE - 2+ VIEWS COMPARISON:  01/06/2018 FINDINGS: Displaced fracture through the mid left clavicle. The distal clavicle fragment is displaced inferiorly 1 shaft width. [facility] and glenohumeral joints appear intact. Multiple posterior left rib fractures noted. IMPRESSION: Displaced mid left clavicle fracture and multiple posterior left rib fractures. Electronically Signed   By: Charlett Nose M.D.   On: 01/08/2018 10:33   Dg Pelvis Comp Min 3v  Result Date: 01/08/2018 CLINICAL DATA:  Fracture EXAM: JUDET PELVIS - 3+ VIEW COMPARISON:  01/06/2018 FINDINGS: Fractures are again noted through the right superior and inferior pubic rami, stable since prior study. No proximal femoral abnormality. No subluxation or dislocation. IMPRESSION: Stable right superior and inferior pubic rami fractures. Electronically Signed   By: Charlett Nose M.D.   On: 01/08/2018 10:32   Dg C-arm 1-60 Min  Result Date: 01/09/2018 CLINICAL DATA:  ORIF left clavicle. EXAM: DG C-ARM 61-120 MIN; LEFT CLAVICLE - 2+ VIEWS COMPARISON:  01/08/2018. FINDINGS: Plate and screw fixation left clavicle. Hardware intact. Anatomic alignment. IMPRESSION: Plate and screw fixation left clavicle with anatomic alignment. Electronically Signed   By: Maisie Fus  Register   On: 01/09/2018 09:59    Anti-infectives: Anti-infectives (From admission, onward)   Start     Dose/Rate Route Frequency Ordered Stop   01/09/18 1400  ceFAZolin (ANCEF) IVPB 1 g/50 mL premix  1 g 100 mL/hr over 30 Minutes Intravenous Every 8 hours 01/09/18 1152 01/10/18 0618   01/06/18 2200  clindamycin (CLEOCIN) capsule 300 mg  Status:  Discontinued     300 mg Oral Every 8 hours 01/06/18 1834 01/06/18 2143   01/06/18 2200  clindamycin (CLEOCIN) IVPB 300 mg     300 mg 100 mL/hr over 30 Minutes Intravenous Every 8 hours  01/06/18 2143     01/06/18 1530  clindamycin (CLEOCIN) IVPB 600 mg  Status:  Discontinued     600 mg 100 mL/hr over 30 Minutes Intravenous Every 8 hours 01/06/18 1514 01/06/18 1836   01/06/18 0415  ceFAZolin (ANCEF) IVPB 2g/100 mL premix     2 g 200 mL/hr over 30 Minutes Intravenous  Once 01/06/18 0402 01/06/18 0509      Assessment/Plan: s/p Procedure(s): OPEN REDUCTION INTERNAL FIXATION (ORIF) CLAVICULAR FRACTURE (Left)  PHBC C7 transverse process fracture - cervical collar Scalp laceration -I closed in ED Left open mandible fracture -Dr. Pollyann Kennedy has repaired Right rib fracture 5, 6, left rib fracture 1-5 -Multimodal pain control and pulmonary toilet Pelvic ring fracture including left sacral ala and right pubic rami -Ortho following.  PT Clavicle fracture repaired yesterday by Dr. Carola Frost  Transfer to 6N  LOS: 4 days    Jaysten Essner A 01/10/2018
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
## 2018-01-11 DIAGNOSIS — S42022S Displaced fracture of shaft of left clavicle, sequela: Secondary | ICD-10-CM

## 2018-01-11 DIAGNOSIS — T1490XA Injury, unspecified, initial encounter: Secondary | ICD-10-CM

## 2018-01-11 MED ORDER — HYDROXYZINE HCL 10 MG PO TABS
10.0000 mg | ORAL_TABLET | Freq: Three times a day (TID) | ORAL | Status: DC | PRN
  Administered 2018-01-11 – 2018-01-16 (×3): 10 mg via ORAL
  Filled 2018-01-11 (×4): qty 1

## 2018-01-11 MED ORDER — PRAZOSIN HCL 2 MG PO CAPS
5.0000 mg | ORAL_CAPSULE | Freq: Every day | ORAL | Status: DC
  Administered 2018-01-11 – 2018-01-16 (×6): 5 mg via ORAL
  Filled 2018-01-11 (×7): qty 1

## 2018-01-11 MED ORDER — POTASSIUM CHLORIDE IN NACL 20-0.9 MEQ/L-% IV SOLN: INTRAVENOUS | Status: DC | PRN

## 2018-01-11 MED ORDER — BACITRACIN ZINC 500 UNIT/GM EX OINT
TOPICAL_OINTMENT | Freq: Two times a day (BID) | CUTANEOUS | Status: DC
  Administered 2018-01-11 – 2018-01-17 (×11): via TOPICAL
  Filled 2018-01-11 (×7): qty 28.4
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
## 2018-01-11 NOTE — Progress Notes (Signed)
RN currently at patient's bedside to administer pain medication. While at patient's bedside, Orthoptist mom and patient discussing patient's housing arrangements. Per this conversation, mom states..."where do they expect you to go." Writer intervenes by asking patient and mom to clarify patient's discharge dispo. Patient states..." I can't return to my previous residence so therefore I am homeless. Where do they expect me to go?" Writer informed patient and family of plans to place a Social Work Consult for assistance with housing. Patient and family expressing gratitude towards Clinical research associate for assistance.
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
## 2018-01-11 NOTE — Progress Notes (Signed)
RN currently present at patient's bedside with Tresa Endo Rayburn, PA-C to complete head  to toe assessment and address any and all concerns. During this time, all of patient and family's questions were addressed. PA discussed the current POC, treatment options and future needs. Patient expressing concerns related to his consideration for Inpatient therapy at [facility]. Patient states.." I know I will need ongoing therapy in order to get better." PA acknowledged patient's concerns and reassured the patient and his family she would place an order for CIR to review patient for consideration of acceptance. Meanwhile, PA verbalized in great detail actions to be taken by Mr. Lento and his family to assist in the healing process. RN clarified all dressing change orders in addition too, plans for suture removal of left jaw (Friday) and staple removal to left temporal area and left chest area with steri strips to replace staples to left chest. Patient provided two ice packs for right and left hip pain, new medication orders placed for anxiety, and new orders placed for oral care dressings.

## 2018-01-11 NOTE — Progress Notes (Signed)
Handoff from night nurse now complete.
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
## 2018-01-11 NOTE — Plan of Care (Signed)

## 2018-01-12 ENCOUNTER — Inpatient Hospital Stay (HOSPITAL_COMMUNITY): Payer: No Typology Code available for payment source

## 2018-01-12 ENCOUNTER — Encounter (HOSPITAL_COMMUNITY): Payer: Self-pay | Admitting: Orthopedic Surgery

## 2018-01-12 DIAGNOSIS — S32810A Multiple fractures of pelvis with stable disruption of pelvic ring, initial encounter for closed fracture: Secondary | ICD-10-CM | POA: Diagnosis present

## 2018-01-12 DIAGNOSIS — S42022A Displaced fracture of shaft of left clavicle, initial encounter for closed fracture: Secondary | ICD-10-CM

## 2018-01-12 DIAGNOSIS — S5402XA Injury of ulnar nerve at forearm level, left arm, initial encounter: Secondary | ICD-10-CM | POA: Diagnosis present

## 2018-01-12 HISTORY — DX: Displaced fracture of shaft of left clavicle, initial encounter for closed fracture: S42.022A

## 2018-01-12 LAB — BASIC METABOLIC PANEL
Anion gap: 13 (ref 5–15)
BUN: 10 mg/dL (ref 6–20)
CHLORIDE: 102 mmol/L (ref 98–111)
CO2: 22 mmol/L (ref 22–32)
Calcium: 8.9 mg/dL (ref 8.9–10.3)
Creatinine, Ser: 0.76 mg/dL (ref 0.61–1.24)
GFR calc non Af Amer: >60 mL/min (ref >60)
Glucose, Bld: 99 mg/dL (ref 70–99)
Potassium: 4.2 mmol/L (ref 3.5–5.1)
Sodium: 137 mmol/L (ref 135–145)

## 2018-01-12 LAB — CBC
HCT: 30.5 % — ABNORMAL LOW (ref 39.0–52.0)
HEMOGLOBIN: 9.8 g/dL — AB (ref 13.0–17.0)
MCH: 28.2 pg (ref 26.0–34.0)
MCHC: 32.1 g/dL (ref 30.0–36.0)
MCV: 87.6 fL (ref 80.0–100.0)
Platelets: 268 10*3/uL (ref 150–400)
RBC: 3.48 MIL/uL — ABNORMAL LOW (ref 4.22–5.81)
RDW: 13.5 % (ref 11.5–15.5)
WBC: 7 10*3/uL (ref 4.0–10.5)
nRBC: 0 % (ref 0.0–0.2)

## 2018-01-12 LAB — TRIGLYCERIDES: TRIGLYCERIDES: 224 mg/dL — AB (ref <150)

## 2018-01-12 MED ORDER — HYDROMORPHONE HCL 1 MG/ML IJ SOLN
0.5000 mg | INTRAMUSCULAR | Status: DC | PRN
  Administered 2018-01-12 – 2018-01-15 (×11): 0.5 mg via INTRAVENOUS
  Filled 2018-01-12 (×12): qty 1

## 2018-01-12 MED ORDER — GABAPENTIN 300 MG PO CAPS
300.0000 mg | ORAL_CAPSULE | Freq: Three times a day (TID) | ORAL | Status: DC
  Administered 2018-01-12 – 2018-01-14 (×9): 300 mg via ORAL
  Filled 2018-01-12 (×9): qty 1

## 2018-01-12 MED ORDER — METHOCARBAMOL 500 MG PO TABS
750.0000 mg | ORAL_TABLET | Freq: Three times a day (TID) | ORAL | Status: DC
  Administered 2018-01-12 – 2018-01-14 (×9): 750 mg via ORAL
  Filled 2018-01-12 (×9): qty 2

## 2018-01-12 NOTE — Progress Notes (Signed)
Inpatient Rehabilitation-Admissions Coordinator   Noted progress with therapies. At this time, pt does not require CIR. Noted plan for home with HH per CM. AC will sign off.   Nanine Means, OTR/L  Rehab Admissions Coordinator  [phone] 01/12/2018 5:31 PM
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
## 2018-01-12 NOTE — Progress Notes (Signed)
PT Cancellation Note  Patient Details Name: Ricky Graham MRN: [id number] DOB: Jun 25, 1990   Cancelled Treatment:    Reason Eval/Treat Not Completed: Pain limiting ability to participate;Patient declined, no reason specified.  Pt seated EOB, irritated, but not able to tell me why.  Finally, pt stating, "I don't want to do anything right now.  I just woke up and I need pain medicine!".  RN informed and PT will check back later.   Thanks,    Rollene Rotunda. Arnie Clingenpeel, PT, DPT  Acute Rehabilitation [phone] pager #[phone] office   01/12/2018, 10:06 AM
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
## 2018-01-13 MED ORDER — POLYETHYLENE GLYCOL 3350 17 G PO PACK
17.0000 g | PACK | Freq: Two times a day (BID) | ORAL | Status: DC
  Administered 2018-01-13 – 2018-01-15 (×5): 17 g via ORAL
  Filled 2018-01-13 (×7): qty 1

## 2018-01-13 MED ORDER — OXYCODONE HCL 5 MG PO TABS
5.0000 mg | ORAL_TABLET | Freq: Four times a day (QID) | ORAL | Status: DC | PRN
  Administered 2018-01-13 – 2018-01-17 (×12): 10 mg via ORAL
  Filled 2018-01-13 (×12): qty 2
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
## 2018-01-14 MED ORDER — SENNOSIDES-DOCUSATE SODIUM 8.6-50 MG PO TABS: 1.0000 | ORAL_TABLET | Freq: Two times a day (BID) | ORAL | Status: DC | PRN
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
## 2018-01-15 MED ORDER — GABAPENTIN 400 MG PO CAPS
400.0000 mg | ORAL_CAPSULE | Freq: Three times a day (TID) | ORAL | Status: DC
  Administered 2018-01-15 – 2018-01-17 (×7): 400 mg via ORAL
  Filled 2018-01-15 (×7): qty 1

## 2018-01-15 MED ORDER — METHOCARBAMOL 500 MG PO TABS
1000.0000 mg | ORAL_TABLET | Freq: Three times a day (TID) | ORAL | Status: DC
  Administered 2018-01-15 – 2018-01-17 (×7): 1000 mg via ORAL
  Filled 2018-01-15 (×6): qty 2

## 2018-01-15 MED ORDER — TRAMADOL HCL 50 MG PO TABS
50.0000 mg | ORAL_TABLET | Freq: Four times a day (QID) | ORAL | Status: DC
  Administered 2018-01-15 – 2018-01-17 (×8): 50 mg via ORAL
  Filled 2018-01-15 (×8): qty 1

## 2018-01-15 NOTE — Progress Notes (Signed)
Pt very concerned with pain regimen. Pt calls out for pain meds and when RN enters room, he is laughing and on his phn. Pt asked several times throughout the night for IV pain meds instead of oral. RN had a lengthy discussion regarding PRN pain meds and that if he was asleep, I would not wake him up for them. Pt also concerned about meds he will be sent home on. Advised to discuss with rounding provider.
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
## 2018-01-15 NOTE — Progress Notes (Signed)
Trauma Service Note  Subjective: Patient still feels as though his pain is not being well controlled.  Will increase Robaxin and Neurontin  Objective: Vital signs in last 24 hours: Temp:  [97.9 F (36.6 C)-99.1 F (37.3 C)] 98.4 F (36.9 C) (10/21 0700) Pulse Rate:  [78-87] 87 (10/21 0300) Resp:  [18] 18 (10/20 2300) BP: (123-140)/(70-78) 125/76 (10/21 0300) SpO2:  [99 %-100 %] 99 % (10/21 0300) Last BM Date: (PTA)  Intake/Output from previous day: 10/20 0701 - 10/21 0700 In: 1866.5 [P.O.:1680; I.V.:86; IV Piggyback:100.5] Out: 350 [Urine:350] Intake/Output this shift: No intake/output data recorded.  General: No disposition yet.  Lungs: Clear  Abd: Soft, good bowel sounds.  Extremities: No changes  Neuro: Intact  Lab Results: CBC  No results for input(s): WBC, HGB, HCT, PLT in the last 72 hours. BMET No results for input(s): NA, K, CL, CO2, GLUCOSE, BUN, CREATININE, CALCIUM in the last 72 hours. PT/INR No results for input(s): LABPROT, INR in the last 72 hours. ABG No results for input(s): PHART, HCO3 in the last 72 hours.  Invalid input(s): PCO2, PO2  Studies/Results: No results found.  Anti-infectives: Anti-infectives (From admission, onward)   Start     Dose/Rate Route Frequency Ordered Stop   01/09/18 1400  ceFAZolin (ANCEF) IVPB 1 g/50 mL premix     1 g 100 mL/hr over 30 Minutes Intravenous Every 8 hours 01/09/18 1152 01/10/18 1445   01/06/18 2200  clindamycin (CLEOCIN) capsule 300 mg  Status:  Discontinued     300 mg Oral Every 8 hours 01/06/18 1834 01/06/18 2143   01/06/18 2200  clindamycin (CLEOCIN) IVPB 300 mg     300 mg 100 mL/hr over 30 Minutes Intravenous Every 8 hours 01/06/18 2143 01/14/18 2311   01/06/18 1530  clindamycin (CLEOCIN) IVPB 600 mg  Status:  Discontinued     600 mg 100 mL/hr over 30 Minutes Intravenous Every 8 hours 01/06/18 1514 01/06/18 1836   01/06/18 0415  ceFAZolin (ANCEF) IVPB 2g/100 mL premix     2 g 200 mL/hr over 30  Minutes Intravenous  Once 01/06/18 0402 01/06/18 0509      Assessment/Plan: s/p Procedure(s): OPEN REDUCTION INTERNAL FIXATION (ORIF) CLAVICULAR FRACTURE Pain control  Discharge once we have adequate disposition  LOS: 9 days   Marta Lamas. Gae Bon, MD, FACS [phone] Trauma Surgeon 01/15/2018

## 2018-01-15 NOTE — Progress Notes (Signed)
Chaplain rec'd referral from nurse huddle.  Pt wanted document notarized.  Visited with pt and pt's mother, and learned the form was not an Scientist, water quality.  Chaplain contacted the [facility] that our office staff only notarizes  Advanced Directives, not other legal docs.  Chaplain informed nurse who is informing pt's mother.  Will be available as needed. Lynnell Chad [phone] pager

## 2018-01-15 NOTE — Progress Notes (Signed)
Received consult for homelessness, CSW Rutherford Nail has previously visited patient with patient's mother and grandmother at bedside to address housing issues on 10/15, housing and community resources given to patient.   CSW signing off.   [place], [place] [phone]
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
## 2018-01-16 ENCOUNTER — Other Ambulatory Visit: Payer: Self-pay

## 2018-01-16 ENCOUNTER — Encounter (HOSPITAL_COMMUNITY): Payer: Self-pay | Admitting: *Deleted

## 2018-01-16 MED ORDER — HYDROXYZINE HCL 10 MG PO TABS: 10.0000 mg | ORAL_TABLET | Freq: Three times a day (TID) | ORAL | 0 refills | Status: DC | PRN

## 2018-01-16 MED ORDER — TRAMADOL HCL 50 MG PO TABS: 50.0000 mg | ORAL_TABLET | Freq: Four times a day (QID) | ORAL | 0 refills | Status: DC | PRN

## 2018-01-16 MED ORDER — DOCUSATE SODIUM 100 MG PO CAPS: 100.0000 mg | ORAL_CAPSULE | Freq: Two times a day (BID) | ORAL | 0 refills | Status: DC | PRN

## 2018-01-16 MED ORDER — POLYETHYLENE GLYCOL 3350 17 G PO PACK: 17.0000 g | PACK | Freq: Every day | ORAL | 0 refills | Status: DC | PRN

## 2018-01-16 MED ORDER — METHOCARBAMOL 500 MG PO TABS: 1000.0000 mg | ORAL_TABLET | Freq: Three times a day (TID) | ORAL | 0 refills | Status: DC

## 2018-01-16 MED ORDER — ORAL CARE MOUTH RINSE: 15.0000 mL | Freq: Two times a day (BID) | OROMUCOSAL | 0 refills | Status: DC

## 2018-01-16 MED ORDER — GABAPENTIN 400 MG PO CAPS: 400.0000 mg | ORAL_CAPSULE | Freq: Three times a day (TID) | ORAL | 0 refills | Status: DC

## 2018-01-16 MED ORDER — PRAZOSIN HCL 5 MG PO CAPS: 5.0000 mg | ORAL_CAPSULE | Freq: Every day | ORAL | 0 refills | Status: DC

## 2018-01-16 MED ORDER — BACITRACIN ZINC 500 UNIT/GM EX OINT: TOPICAL_OINTMENT | Freq: Two times a day (BID) | CUTANEOUS | 0 refills | Status: DC

## 2018-01-16 MED ORDER — OXYCODONE HCL 5 MG PO TABS: 5.0000 mg | ORAL_TABLET | Freq: Four times a day (QID) | ORAL | 0 refills | Status: DC | PRN

## 2018-01-16 MED ORDER — ACETAMINOPHEN 500 MG PO TABS: 1000.0000 mg | ORAL_TABLET | Freq: Four times a day (QID) | ORAL | 0 refills | Status: DC | PRN

## 2018-01-16 NOTE — Progress Notes (Signed)
Orthopedic Tech Progress Note Patient Details:  Ricky Graham January 06, 1991 [id number]  Ortho Devices Type of Ortho Device: Soft collar Ortho Device/Splint Location: neck Ortho Device/Splint Interventions: Application   Post Interventions Patient Tolerated: Well Instructions Provided: Care of device   Ricky Graham 01/16/2018, 9:00 AM
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
## 2018-01-16 NOTE — Progress Notes (Signed)
Ortho tech called regarding soft collar; will be up shortly to deliver.

## 2018-01-16 NOTE — Progress Notes (Signed)
Patient ID: Ricky Graham, male   DOB: [birth year], 27 y.o.   MRN: [id number] Plan D/C tomorrow. He will go home with his mother. RN CM working on [employer] program. Violeta Gelinas, MD, MPH, FACS Trauma: [phone] General Surgery: [phone]
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
## 2018-01-17 MED FILL — traMADol HCL 50 MG TABS: 50 | 8 days supply | Qty: 30 | Fill #0

## 2018-01-17 MED FILL — METHOCARBAMOL 500 MG TABS: 500 | 10 days supply | Qty: 60 | Fill #0

## 2018-01-17 MED FILL — PRAZOSIN 5 MG CAPSULE: 5 | 30 days supply | Qty: 30 | Fill #0

## 2018-01-17 MED FILL — oxyCODONE HCL 5 MG TABS: 5 | 4 days supply | Qty: 30 | Fill #0

## 2018-01-17 MED FILL — hydrOXYzine HCL 10 MG TABS: 10 | 10 days supply | Qty: 30 | Fill #0

## 2018-01-17 MED FILL — GABAPENTIN 400 MG CAPSULE: 400 | 30 days supply | Qty: 90 | Fill #0
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
## 2018-01-17 NOTE — Discharge Instructions (Addendum)
You should establish care with a primary care doctor or PA in [place]. They will be able to help you with medications, physician referrals and other resources as well.   Dr. Pollyann Kennedy wants your Arch bars removed in 5 weeks. If you are unable to follow up with him, you will need to find an ENT or oral surgeon in [place] (a primary care provider would be able to assist with this)  Liquids and soft foods only.  Keep dental elastics on, one on the right and one on the left at all times.  Use the dental wax as needed to protect your lips and gums.  Orthopaedic Trauma Service Discharge Instructions   General Discharge Instructions  WEIGHT BEARING STATUS: Weightbear as tolerate B lower extremities, WBAT L upper extremity   RANGE OF MOTION/ACTIVITY: range of motion as tolerated B lower extremities and L upper extremity. Activity as tolerated   Wound Care: wound care as needed L shoulder. See below  Discharge Wound Care Instructions  Do NOT apply any ointments, solutions or lotions to pin sites or surgical wounds.  These prevent needed drainage and even though solutions like hydrogen peroxide kill bacteria, they also damage cells lining the pin sites that help fight infection.  Applying lotions or ointments can keep the wounds moist and can cause them to breakdown and open up as well. This can increase the risk for infection. When in doubt call the office.  Surgical incisions should be dressed daily.  If any drainage is noted, use one layer of adaptic, then gauze, Kerlix, and an ace wrap.  Once the incision is completely dry and without drainage, it may be left open to air out.  Showering may begin 36-48 hours later.  Cleaning gently with soap and water.  Traumatic wounds should be dressed daily as well.    One layer of adaptic, gauze, Kerlix, then ace wrap.  The adaptic can be discontinued once the draining has ceased    If you have a wet to dry dressing: wet the gauze with saline the  squeeze as much saline out so the gauze is moist (not soaking wet), place moistened gauze over wound, then place a dry gauze over the moist one, followed by Kerlix wrap, then ace wrap.   Diet: as you were eating previously.  Can use over the counter stool softeners and bowel preparations, such as Miralax, to help with bowel movements.  Narcotics can be constipating.  Be sure to drink plenty of fluids  PAIN MEDICATION USE AND EXPECTATIONS  You have likely been given narcotic medications to help control your pain.  After a traumatic event that results in an fracture (broken bone) with or without surgery, it is ok to use narcotic pain medications to help control one's pain.  We understand that everyone responds to pain differently and each individual patient will be evaluated on a regular basis for the continued need for narcotic medications. Ideally, narcotic medication use should last no more than 6-8 weeks (coinciding with fracture healing).   As a patient it is your responsibility as well to monitor narcotic medication use and report the amount and frequency you use these medications when you come to your office visit.   We would also advise that if you are using narcotic medications, you should take a dose prior to therapy to maximize you participation.  IF YOU ARE ON NARCOTIC MEDICATIONS IT IS NOT PERMISSIBLE TO OPERATE A MOTOR VEHICLE (MOTORCYCLE/CAR/TRUCK/MOPED) OR HEAVY MACHINERY DO NOT MIX NARCOTICS WITH  OTHER CNS (CENTRAL NERVOUS SYSTEM) DEPRESSANTS SUCH AS ALCOHOL   STOP SMOKING OR USING NICOTINE PRODUCTS!!!!  As discussed nicotine severely impairs your body's ability to heal surgical and traumatic wounds but also impairs bone healing.  Wounds and bone heal by forming microscopic blood vessels (angiogenesis) and nicotine is a vasoconstrictor (essentially, shrinks blood vessels).  Therefore, if vasoconstriction occurs to these microscopic blood vessels they essentially disappear and are unable  to deliver necessary nutrients to the healing tissue.  This is one modifiable factor that you can do to dramatically increase your chances of healing your injury.    (This means no smoking, no nicotine gum, patches, etc)  DO NOT USE NONSTEROIDAL ANTI-INFLAMMATORY DRUGS (NSAID'S)  Using products such as Advil (ibuprofen), Aleve (naproxen), Motrin (ibuprofen) for additional pain control during fracture healing can delay and/or prevent the healing response.  If you would like to take over the counter (OTC) medication, Tylenol (acetaminophen) is ok.  However, some narcotic medications that are given for pain control contain acetaminophen as well. Therefore, you should not exceed more than 4000 mg of tylenol in a day if you do not have liver disease.  Also note that there are may OTC medicines, such as cold medicines and allergy medicines that my contain tylenol as well.  If you have any questions about medications and/or interactions please ask your doctor/PA or your pharmacist.      ICE AND ELEVATE INJURED/OPERATIVE EXTREMITY  Using ice and elevating the injured extremity above your heart can help with swelling and pain control.  Icing in a pulsatile fashion, such as 20 minutes on and 20 minutes off, can be followed.    Do not place ice directly on skin. Make sure there is a barrier between to skin and the ice pack.    Using frozen items such as frozen peas works well as the conform nicely to the are that needs to be iced.  USE AN ACE WRAP OR TED HOSE FOR SWELLING CONTROL  In addition to icing and elevation, Ace wraps or TED hose are used to help limit and resolve swelling.  It is recommended to use Ace wraps or TED hose until you are informed to stop.    When using Ace Wraps start the wrapping distally (farthest away from the body) and wrap proximally (closer to the body)   Example: If you had surgery on your leg or thing and you do not have a splint on, start the ace wrap at the toes and work your way  up to the thigh        If you had surgery on your upper extremity and do not have a splint on, start the ace wrap at your fingers and work your way up to the upper arm  IF YOU ARE IN A SPLINT OR CAST DO NOT REMOVE IT FOR ANY REASON   If your splint gets wet for any reason please contact the office immediately. You may shower in your splint or cast as long as you keep it dry.  This can be done by wrapping in a cast cover or garbage back (or similar)  Do Not stick any thing down your splint or cast such as pencils, money, or hangers to try and scratch yourself with.  If you feel itchy take benadryl as prescribed on the bottle for itching  IF YOU ARE IN A CAM BOOT (BLACK BOOT)  You may remove boot periodically. Perform daily dressing changes as noted below.  Wash the  liner of the boot regularly and wear a sock when wearing the boot. It is recommended that you sleep in the boot until told otherwise  CALL THE OFFICE WITH ANY QUESTIONS OR CONCERNS: [phone]
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
## 2018-01-17 NOTE — Progress Notes (Signed)
Pt with d/c orders. IV removed. New dressings applied to R hip and L knee. Discharge paperwork reviewed with pt and mother and all questions answered. Printed prescriptions in hand at time of discharge. Pt with possession of wallet. Pt escorted out to car via wheelchair.
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
## 2018-01-18 ENCOUNTER — Encounter (HOSPITAL_COMMUNITY): Payer: Self-pay

## 2018-01-29 ENCOUNTER — Encounter

## 2018-02-07 ENCOUNTER — Emergency Department: Admit: 2018-02-07 | Payer: Legal Liability / Liability Insurance

## 2018-02-07 ENCOUNTER — Inpatient Hospital Stay
Admit: 2018-02-07 | Discharge: 2018-02-07 | Disposition: A | Discharge status: Home / Self Care | Payer: Legal Liability / Liability Insurance

## 2018-02-07 DIAGNOSIS — M25551 Pain in right hip: Secondary | ICD-10-CM

## 2018-02-07 MED ORDER — acetaminophen (TYLENOL) 325 MG tablet
325 | ORAL_TABLET | Freq: Four times a day (QID) | ORAL | 0 refills | 11.00000 days | Status: AC | PRN
Start: 2018-02-07 — End: ?

## 2018-02-07 MED ORDER — gabapentin (NEURONTIN) capsule 400 mg
300 | Freq: Once | ORAL | Status: AC
Start: 2018-02-07 — End: 2018-02-07
  Administered 2018-02-07: 19:00:00 400 mg via ORAL

## 2018-02-07 MED ORDER — methocarbamol (ROBAXIN) 750 MG tablet
750 | ORAL_TABLET | Freq: Four times a day (QID) | ORAL | 0 refills | Status: AC | PRN
Start: 2018-02-07 — End: ?

## 2018-02-07 MED ORDER — methocarbamol (ROBAXIN) tablet 750 mg
500 | Freq: Once | ORAL | Status: AC
Start: 2018-02-07 — End: 2018-02-07
  Administered 2018-02-07: 19:00:00 750 mg via ORAL

## 2018-02-07 MED ORDER — oxyCODONE-acetaminophen (PERCOCET) 5-325 mg per tablet 2 tablet
5-325 | Freq: Once | ORAL | Status: AC
Start: 2018-02-07 — End: 2018-02-07
  Administered 2018-02-07: 18:00:00 2 via ORAL

## 2018-02-07 MED ORDER — methocarbamol (ROBAXIN-750) 750 MG tablet
750 | ORAL_TABLET | Freq: Four times a day (QID) | ORAL | 0 refills | Status: AC | PRN
Start: 2018-02-07 — End: ?
  Filled 2018-02-07: qty 25, 7d supply, fill #0

## 2018-02-07 MED FILL — GABAPENTIN 100 MG CAPSULE: 100 100 MG | ORAL | Qty: 1

## 2018-02-07 MED FILL — METHOCARBAMOL 500 MG TABLET: 500 500 MG | ORAL | Qty: 2

## 2018-02-07 MED FILL — OXYCODONE-ACETAMINOPHEN 5 MG-325 MG TABLET: 5-325 5-325 mg | ORAL | Qty: 2

## 2018-02-07 NOTE — Unmapped (Signed)
Discharge instructions    * Take all your medications as directed.  * Use Robaxin for muscle spasms. Do not drink alcohol, drive or operate heavy machinery while taking Robaxin as it will cause sedation.  * Take tylenol/acetaminophen (650mg -1000mg  every 6 hours) as needed for pain.  Do not take more than 4 g of Tylenol/acetaminophen in a 24-hour period.      * Unfortunately the emergency department cannot provide refills for controlled substances such as Percocet.  I recommend that you either follow-up with your surgeons in [facility] or obtain a primary care provider here in [place].  See primary care contacted information provided below stated.  * Return to the Emergency Department for any new or worsening symptoms    Do you need a primary care physician?     Primary care physicians (PCP) most people know what they are, but many do not see the need in having one. People do not realize that PCPs are an integral part of the healthcare spectrum and are playing an ever-increasing role in wellness and disease prevention. So, why is it so important to have a PCP? Here are just a few reasons:     Prevention: Scheduling regular check-ups with your PCP can keep you up-to-date on all preventative wellness. Annual visits for a physical and other diagnostics can determine early signs of a possible condition that may require more care.     Everyday health concerns: For common health issues or minor illnesses (like a cold of flu), a PCP is more than likely all you need to get well. A PCP really is a one stop shop; there is no need to run around to various specialists for common health concerns, as PCPs are general practitioners and have a vast knowledge of all conditions.   Evaluate the severity of your condition: In most cases, your health problems will not be severe and can be addressed by your PCP. But in the event that you need more focused care, your PCP can recommend you to a specialist.     They know your history:  Since you see your PCP on a regular basis, they know your medical background. This gives them the ability to look at all aspects of your health rather than just one incident. By knowing your medical history as well as your family's, PCPs have a better understanding of your overall health status and can provide you with a medical plan that is tailored to your specific profile.     I strongly recommend that you look into establishing a relationship with a primary care physician. Medical care is best given by a doctor who knows you and your medical history. Therefore, it is best to have a personal physician. Please choose a physician and call him/her for periodic health checks and when you are ill.     Below is a list of clinics in the area that could provide you primary care. Call one in your area to find out if they have any restrictions and to set up and appointment.     Physicians and [facility] residents only)   [facility]   [address].   [phone]   [facility]   [address]   [phone]   [facility]   (entrance on [address]   [address].   [phone]   [facility]   [address]   [phone]   [facility]   [address]   [phone]   [facility]   [address].   [phone]   [facility]   [address]   [phone]   [facility]   [address]   [phone]   [facility]   [address]   [phone]   [facility]   [address]   [phone]   [facility]   [address]   [phone]   Other Health Clinics   [facility]   [facility]   [address]   [phone]   [facility]   [address]   [phone]    [facility]   [address]   [phone]   [facility]   [address]   [phone]   [facility]   [address]   [address]   [phone]   [place]   [address]   [phone]   [place]   [address]   [phone]   [place]   [address]   [phone]   [place]   [address]   [phone]
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
## 2018-02-07 NOTE — Unmapped (Signed)
Follow up care discussed. Pain medication discussed. Pt and family verbalize understandings. Prescriptions given x2. Stable and ambulatory upon dismissal.

## 2018-02-10 ENCOUNTER — Emergency Department: Admit: 2018-02-10

## 2018-02-10 ENCOUNTER — Inpatient Hospital Stay: Admit: 2018-02-10 | Discharge: 2018-02-10 | Disposition: A | Discharge status: Home / Self Care

## 2018-02-10 DIAGNOSIS — M79672 Pain in left foot: Secondary | ICD-10-CM

## 2018-02-10 MED ORDER — ibuprofen (ADVIL,MOTRIN) tablet 800 mg
400 | Freq: Once | ORAL | Status: AC
Start: 2018-02-10 — End: 2018-02-10

## 2018-02-10 MED ORDER — oxyCODONE (ROXICODONE) immediate release tablet 5 mg
5 | Freq: Once | ORAL | Status: AC
Start: 2018-02-10 — End: 2018-02-10
  Administered 2018-02-10: 11:00:00 5 mg via ORAL

## 2018-02-10 MED ORDER — morphine injection 6 mg
4 | Freq: Once | INTRAVENOUS | Status: AC
Start: 2018-02-10 — End: 2018-02-10
  Administered 2018-02-10: 12:00:00 6 mg via INTRAMUSCULAR

## 2018-02-10 MED FILL — MORPHINE 4 MG/ML INTRAVENOUS SOLUTION: 4 4 mg/mL | INTRAVENOUS | Qty: 2

## 2018-02-10 MED FILL — IBUPROFEN 400 MG TABLET: 400 400 MG | ORAL | Qty: 2

## 2018-02-10 MED FILL — OXYCODONE 5 MG TABLET: 5 5 MG | ORAL | Qty: 1
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
## 2018-02-10 NOTE — Unmapped (Signed)
ED Attending Attestation Note    Date of service:  02/10/2018    This patient was seen by the resident physician.  I have seen and examined the patient, agree with the workup, evaluation, management and diagnosis. The care plan has been discussed and I concur.     My assessment reveals a 27 y.o. male with moderate pain over the distal metatarsals and the metatarsophalangeal joints of the 2nd 3rd and somewhat of the 4th the left foot with no swelling or deformity.Marland Kitchen
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
## 2018-02-19 MED FILL — OXYCODONE-ACETAMINOPHEN 5-3: 5-325 | 2 days supply | Qty: 5 | Fill #0

## 2018-08-21 IMAGING — CR DG ELBOW COMPLETE 3+V*L*
4 series · 4 of 4 positions shown · non-contrast
Comparison: None.

CLINICAL DATA: Elbow pain

EXAM:
LEFT ELBOW - COMPLETE 3+ VIEW

[elbow ap]
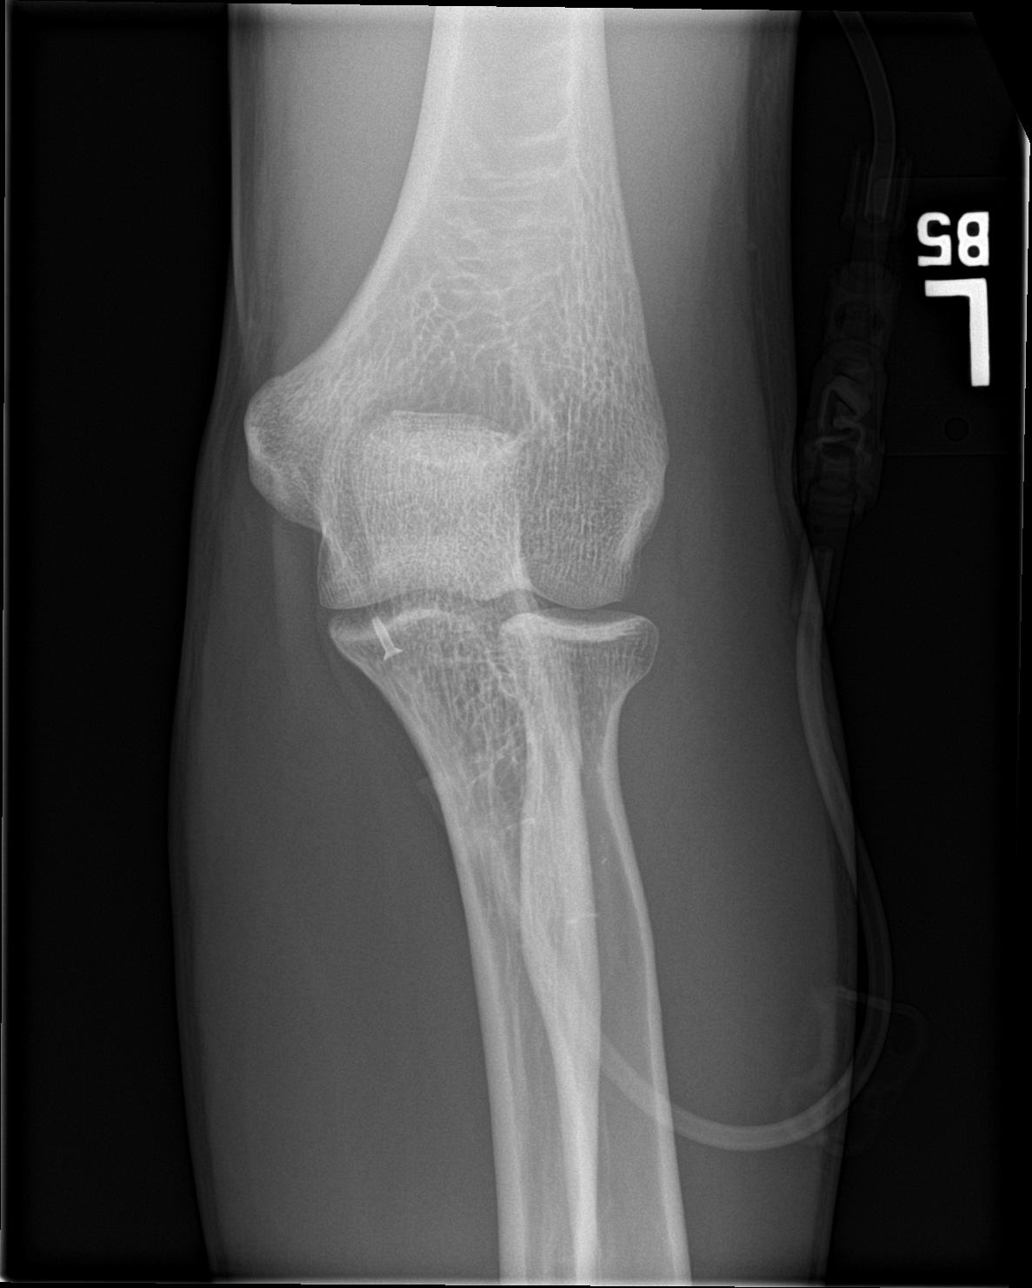

[elbow obl (1 of 2)]
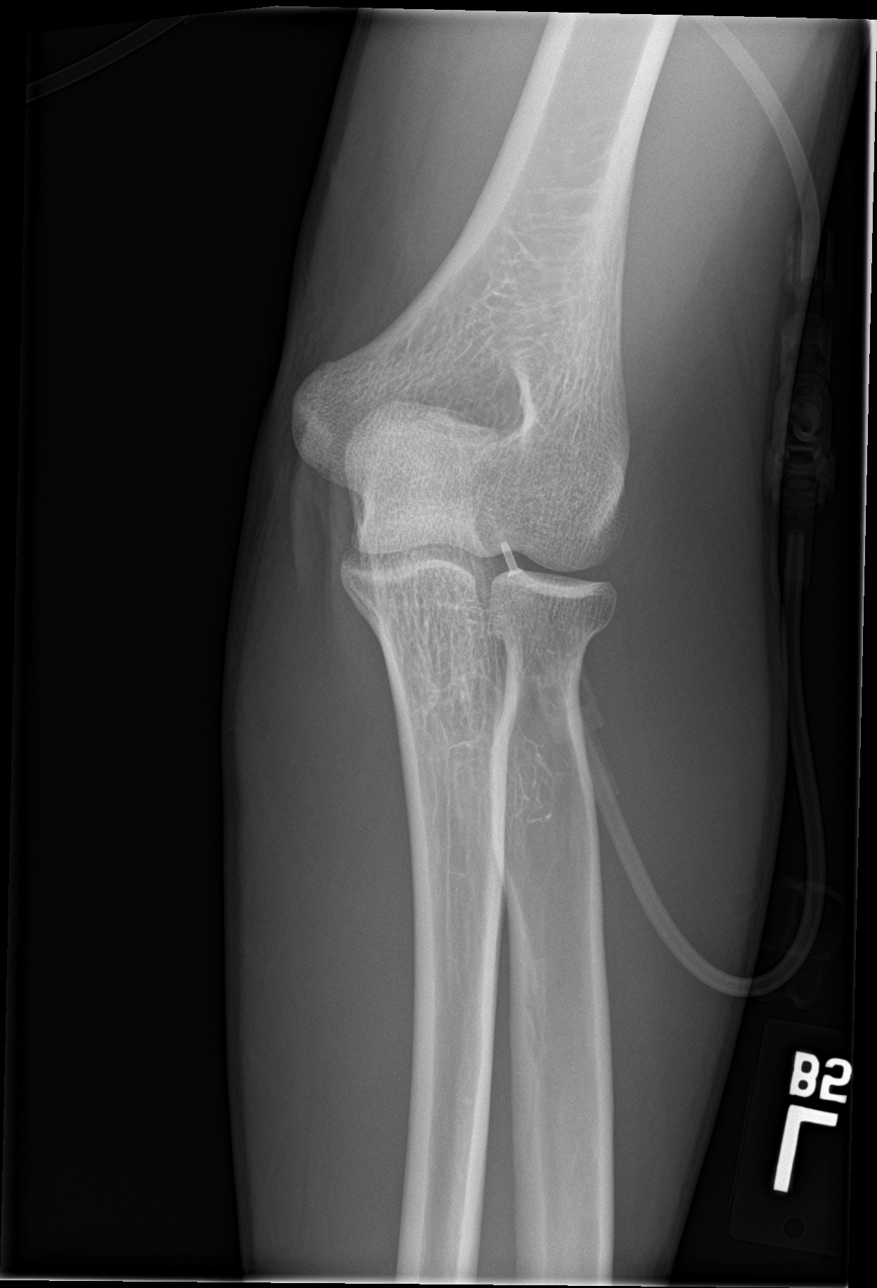

[elbow obl (2 of 2)]
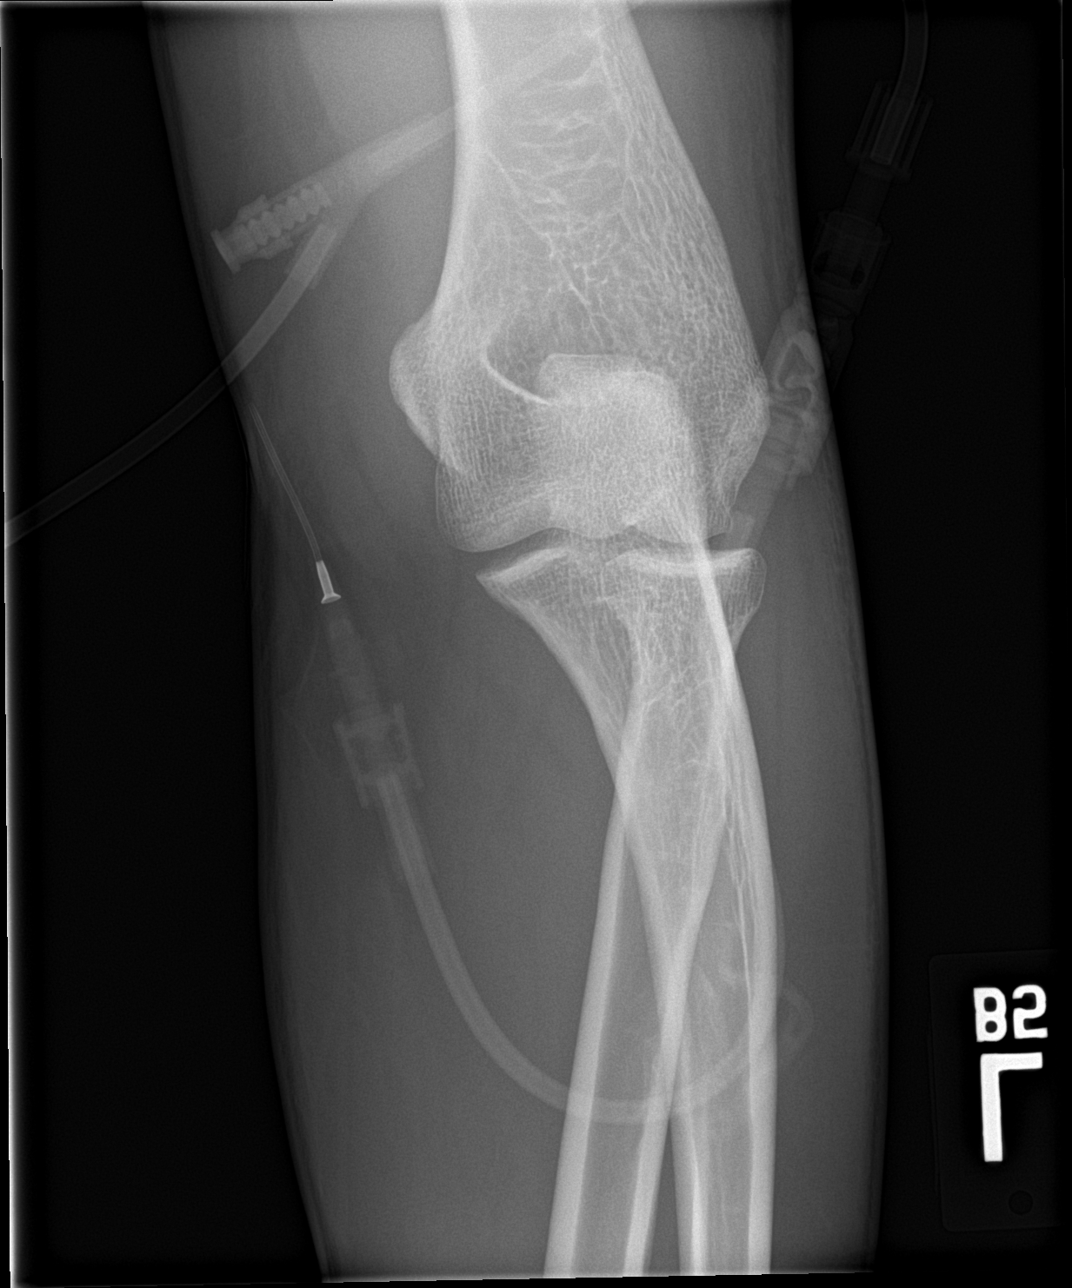

[elbow lat]
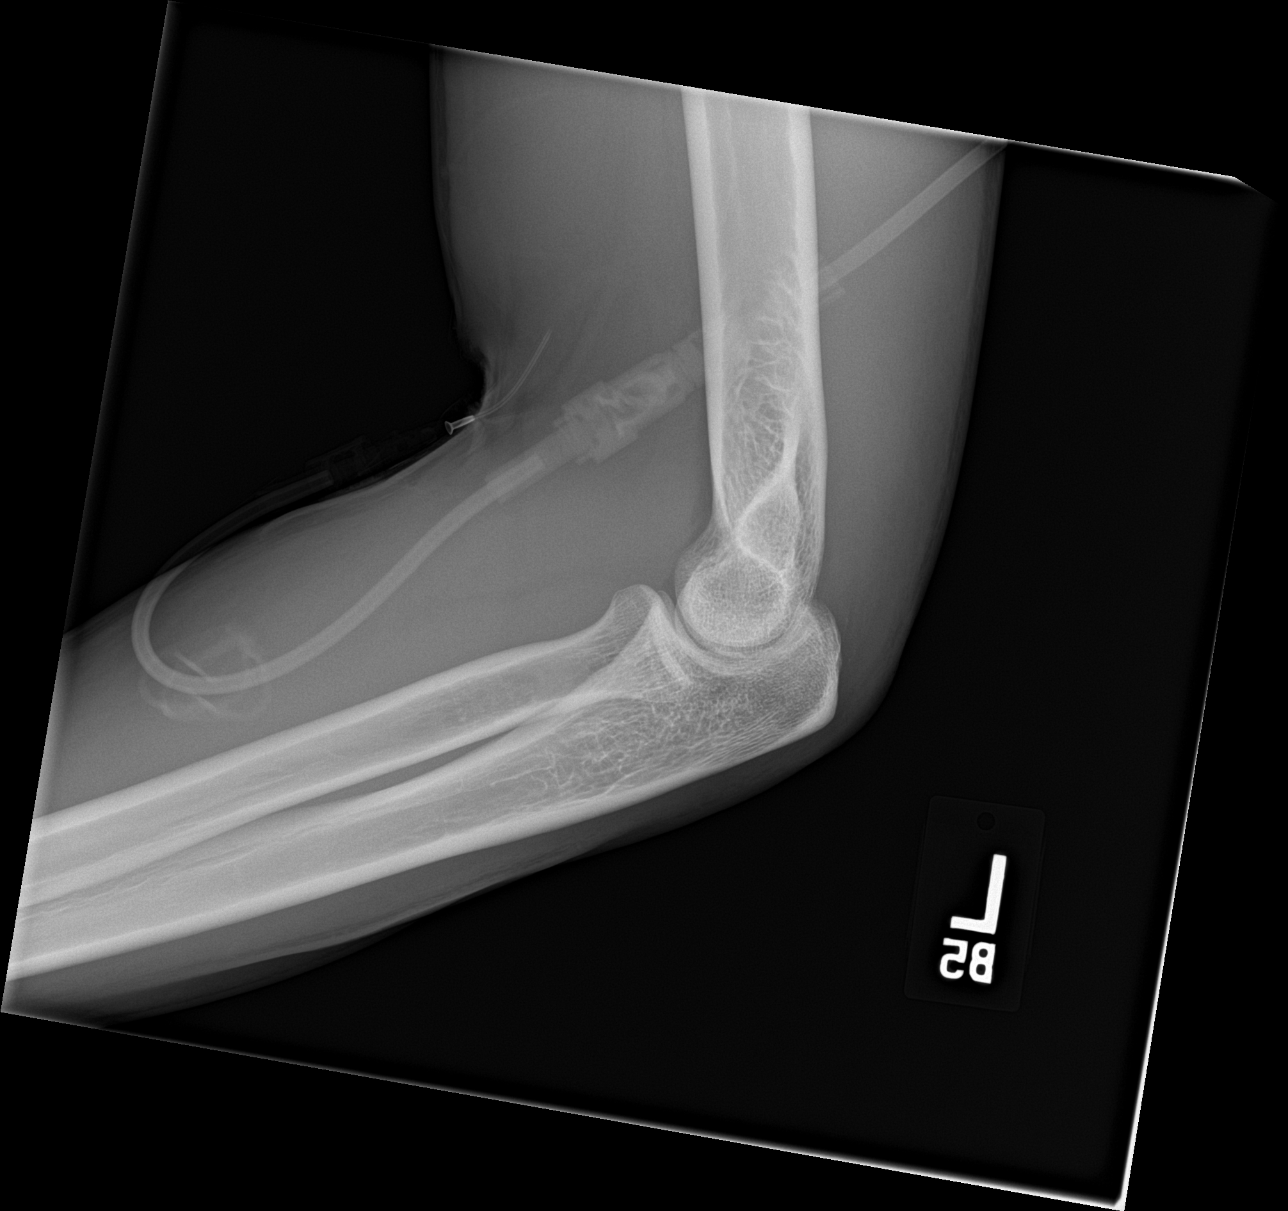

[4 of 4 positions shown; findings below may reference images not displayed]

FINDINGS: There is no evidence of fracture, dislocation, or joint effusion.
There is no evidence of arthropathy or other focal bone abnormality.
Soft tissues are unremarkable.
IMPRESSION: Negative.

## 2018-08-21 IMAGING — CR DG SHOULDER 2+V*L*
3 series · 3 of 3 positions shown · non-contrast
Comparison: None.

CLINICAL DATA: Shoulder pain

EXAM:
LEFT SHOULDER - 2+ VIEW

[shoulder grashey]
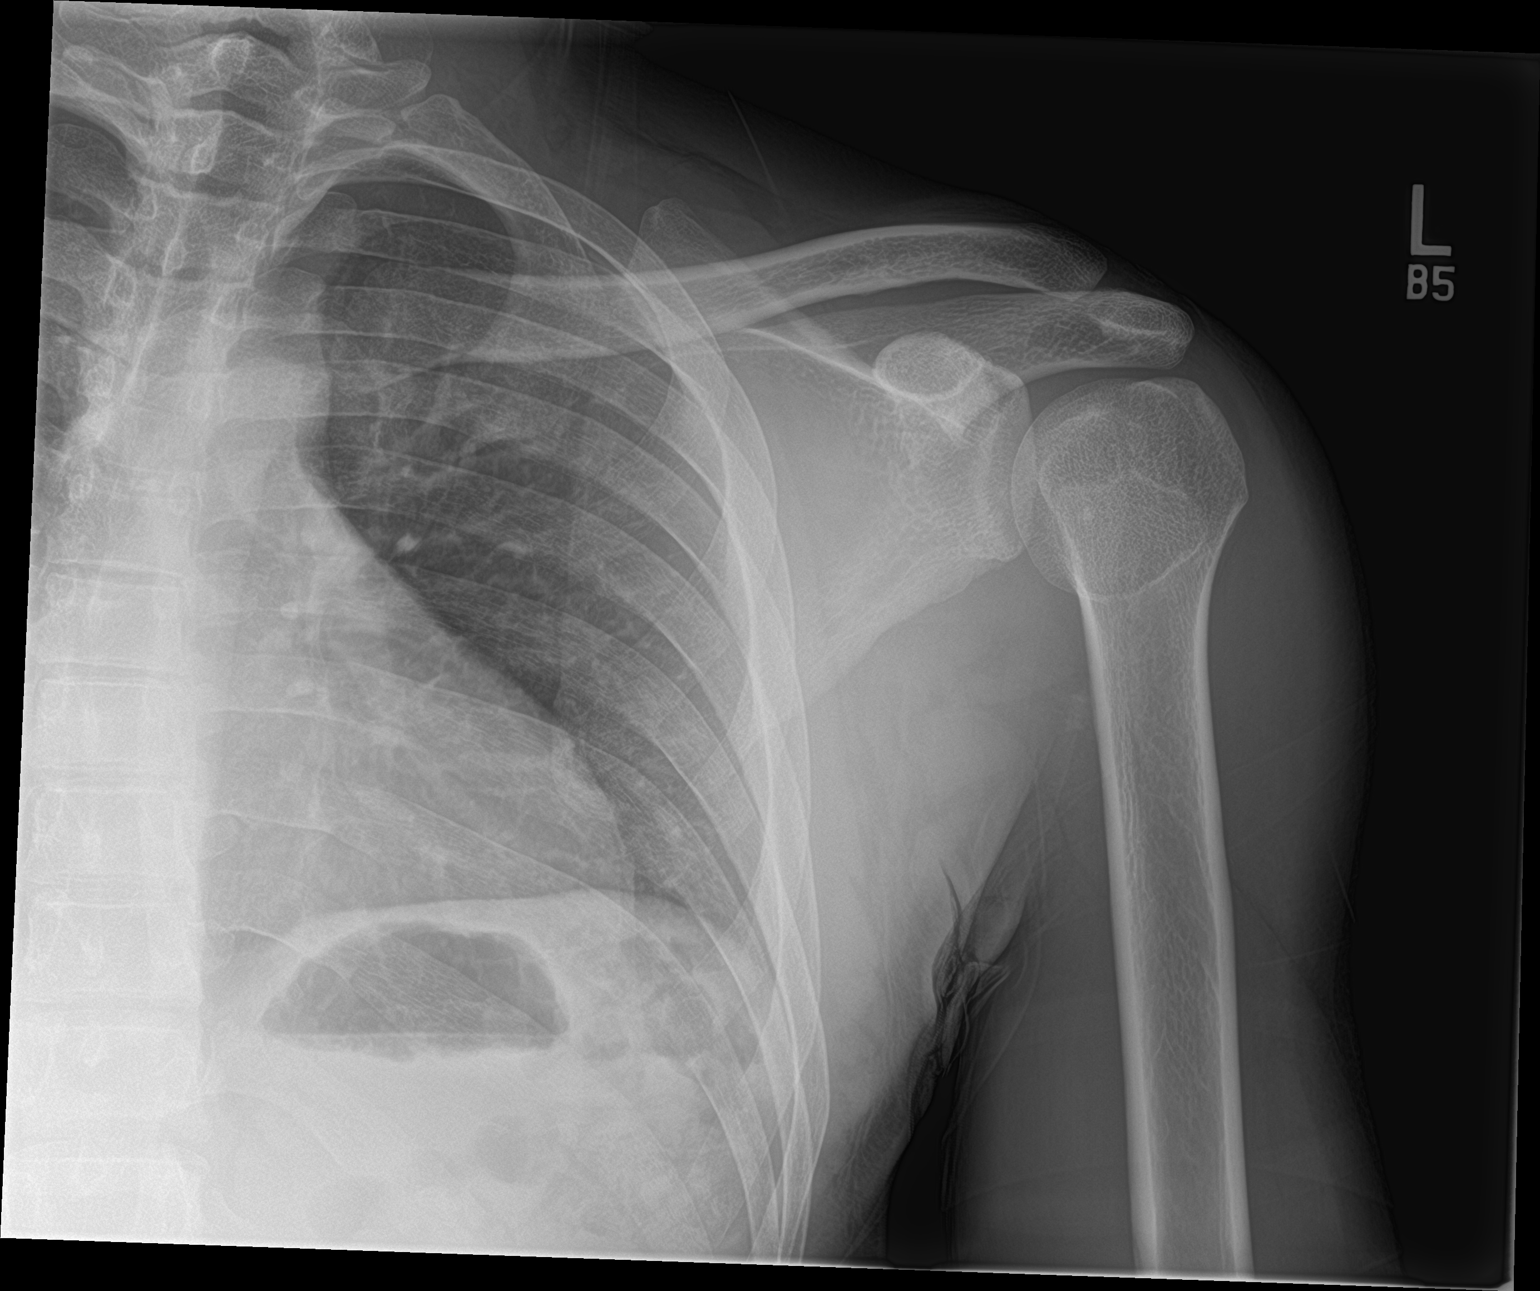

[shoulder y view]
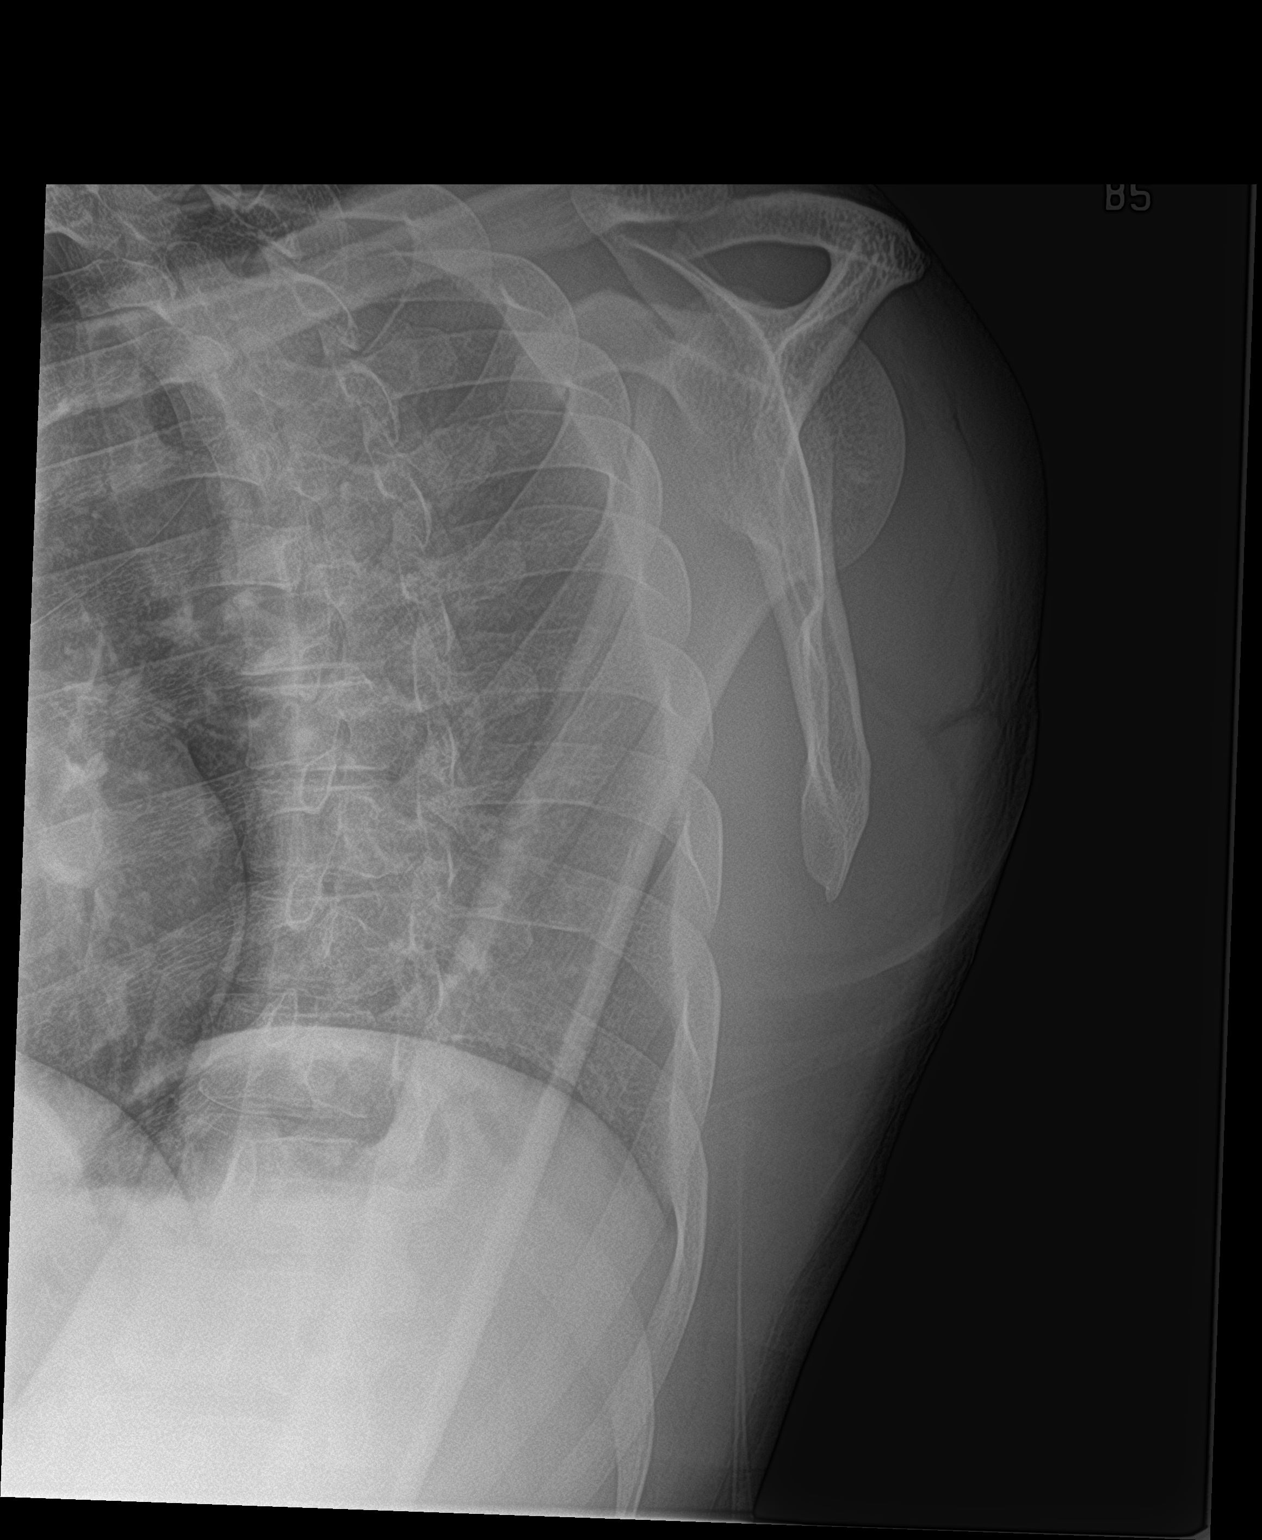

[shoulder axillary]
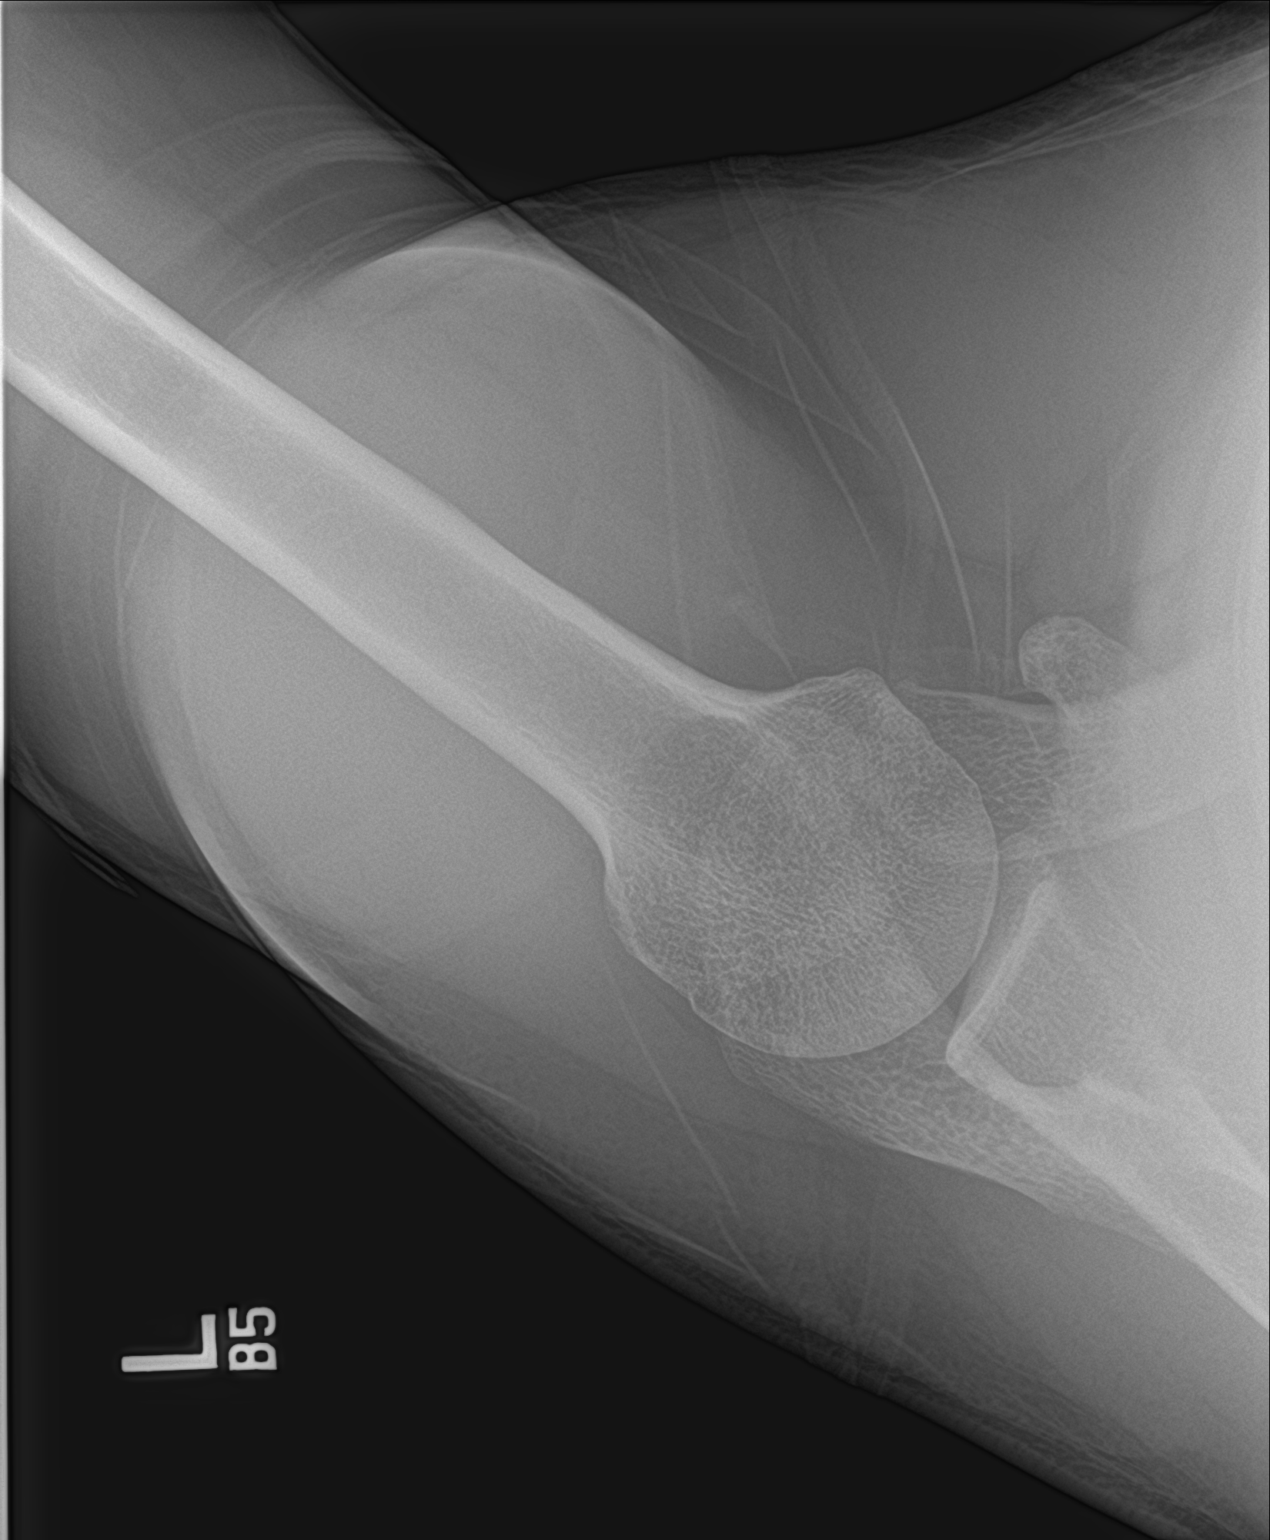

[3 of 3 positions shown; findings below may reference images not displayed]

FINDINGS: There is no evidence of fracture or dislocation. There is no
evidence of arthropathy or other focal bone abnormality. Soft
tissues are unremarkable.
IMPRESSION: Negative.

## 2018-08-22 ENCOUNTER — Encounter (HOSPITAL_BASED_OUTPATIENT_CLINIC_OR_DEPARTMENT_OTHER): Payer: Self-pay | Admitting: *Deleted

## 2018-08-22 ENCOUNTER — Other Ambulatory Visit: Payer: Self-pay
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
## 2018-08-22 NOTE — Progress Notes (Signed)
Pre op call done for surgery 5/29 with Dr Pollyann Kennedy. Pt is in [place] and is not sure that he will be able to come back on Thursday for Covid screen. Explained to pt that surgery need to be cancelled if he does not have test done. Spoke with French Ana at Dr Lucky Rathke office to let her know.  All pre op instructions given to pt.

## 2018-08-23 ENCOUNTER — Other Ambulatory Visit (HOSPITAL_COMMUNITY)
Admission: RE | Admit: 2018-08-23 | Discharge: 2018-08-23 | Disposition: A | Discharge status: Home / Self Care | Payer: HRSA Program | Source: Ambulatory Visit | Attending: Otolaryngology | Admitting: Otolaryngology

## 2018-08-23 DIAGNOSIS — Z1159 Encounter for screening for other viral diseases: Secondary | ICD-10-CM | POA: Insufficient documentation

## 2018-08-23 LAB — SARS CORONAVIRUS 2 BY RT PCR (HOSPITAL ORDER, PERFORMED IN ~~LOC~~ HOSPITAL LAB): SARS Coronavirus 2: NEGATIVE

## 2018-08-23 NOTE — Anesthesia Preprocedure Evaluation (Addendum)
Anesthesia Evaluation  Patient identified by MRN, date of birth, ID band Patient awake    Reviewed: Allergy & Precautions, NPO status , Patient's Chart, lab work & pertinent test results  Airway Mallampati: II  TM Distance: >3 FB Neck ROM: Full    Dental no notable dental hx. (+) Teeth Intact   Pulmonary Current Smoker,    Pulmonary exam normal breath sounds clear to auscultation       Cardiovascular Exercise Tolerance: Good negative cardio ROS Normal cardiovascular exam Rhythm:Regular Rate:Normal     Neuro/Psych negative neurological ROS     GI/Hepatic   Endo/Other    Renal/GU      Musculoskeletal   Abdominal   Peds  Hematology   Anesthesia Other Findings   Reproductive/Obstetrics                           Anesthesia Physical Anesthesia Plan  ASA: II  Anesthesia Plan: General   Post-op Pain Management:    Induction: Intravenous  PONV Risk Score and Plan: 2 and Treatment may vary due to age or medical condition, Ondansetron and Dexamethasone  Airway Management Planned: LMA  Additional Equipment:   Intra-op Plan:   Post-operative Plan:   Informed Consent: I have reviewed the patients History and Physical, chart, labs and discussed the procedure including the risks, benefits and alternatives for the proposed anesthesia with the patient or authorized representative who has indicated his/her understanding and acceptance.     Dental advisory given  Plan Discussed with: CRNA  Anesthesia Plan Comments:      Anesthesia Quick Evaluation

## 2018-08-24 ENCOUNTER — Ambulatory Visit (HOSPITAL_BASED_OUTPATIENT_CLINIC_OR_DEPARTMENT_OTHER)
Admission: RE | Admit: 2018-08-24 | Discharge: 2018-08-24 | Disposition: A | Discharge status: Home / Self Care | Payer: Self-pay | Attending: Otolaryngology | Admitting: Otolaryngology

## 2018-08-24 ENCOUNTER — Ambulatory Visit (HOSPITAL_BASED_OUTPATIENT_CLINIC_OR_DEPARTMENT_OTHER): Payer: Self-pay | Admitting: Anesthesiology

## 2018-08-24 ENCOUNTER — Encounter (HOSPITAL_BASED_OUTPATIENT_CLINIC_OR_DEPARTMENT_OTHER)
Admission: RE | Disposition: A | Discharge status: Home / Self Care | Payer: Self-pay | Source: Home / Self Care | Attending: Otolaryngology

## 2018-08-24 ENCOUNTER — Encounter (HOSPITAL_BASED_OUTPATIENT_CLINIC_OR_DEPARTMENT_OTHER): Payer: Self-pay | Admitting: Anesthesiology

## 2018-08-24 ENCOUNTER — Other Ambulatory Visit: Payer: Self-pay

## 2018-08-24 DIAGNOSIS — S02609D Fracture of mandible, unspecified, subsequent encounter for fracture with routine healing: Secondary | ICD-10-CM | POA: Insufficient documentation

## 2018-08-24 DIAGNOSIS — X58XXXD Exposure to other specified factors, subsequent encounter: Secondary | ICD-10-CM | POA: Insufficient documentation

## 2018-08-24 DIAGNOSIS — F17200 Nicotine dependence, unspecified, uncomplicated: Secondary | ICD-10-CM | POA: Insufficient documentation

## 2018-08-24 HISTORY — PX: MANDIBULAR HARDWARE REMOVAL: SHX5205

## 2018-08-24 SURGERY — REMOVAL, HARDWARE, MANDIBLE
Anesthesia: General | Site: Mouth

## 2018-08-24 MED ORDER — OXYCODONE HCL 5 MG PO TABS
5.0000 mg | ORAL_TABLET | Freq: Once | ORAL | Status: AC
  Administered 2018-08-24: 5 mg via ORAL

## 2018-08-24 MED ORDER — MIDAZOLAM HCL 2 MG/2ML IJ SOLN
INTRAMUSCULAR | Status: AC
  Filled 2018-08-24: qty 2

## 2018-08-24 MED ORDER — GABAPENTIN 300 MG PO CAPS
ORAL_CAPSULE | ORAL | Status: AC
  Filled 2018-08-24: qty 1

## 2018-08-24 MED ORDER — ONDANSETRON HCL 4 MG/2ML IJ SOLN
INTRAMUSCULAR | Status: AC
  Filled 2018-08-24: qty 2

## 2018-08-24 MED ORDER — PROMETHAZINE HCL 25 MG/ML IJ SOLN: 6.2500 mg | INTRAMUSCULAR | Status: DC | PRN

## 2018-08-24 MED ORDER — CLINDAMYCIN HCL 300 MG PO CAPS: 300.0000 mg | ORAL_CAPSULE | Freq: Three times a day (TID) | ORAL | 0 refills | Status: DC

## 2018-08-24 MED ORDER — PROPOFOL 10 MG/ML IV BOLUS
INTRAVENOUS | Status: DC | PRN
  Administered 2018-08-24: 200 mg via INTRAVENOUS

## 2018-08-24 MED ORDER — MEPERIDINE HCL 25 MG/ML IJ SOLN: 6.2500 mg | INTRAMUSCULAR | Status: DC | PRN

## 2018-08-24 MED ORDER — LACTATED RINGERS IV SOLN
INTRAVENOUS | Status: DC
  Administered 2018-08-24: 10:00:00 via INTRAVENOUS
  Administered 2018-08-24: 11:00:00 10 mL/h via INTRAVENOUS

## 2018-08-24 MED ORDER — LIDOCAINE-EPINEPHRINE 1 %-1:100000 IJ SOLN
INTRAMUSCULAR | Status: DC | PRN
  Administered 2018-08-24: 5 mL

## 2018-08-24 MED ORDER — MIDAZOLAM HCL 2 MG/2ML IJ SOLN
1.0000 mg | INTRAMUSCULAR | Status: DC | PRN
  Administered 2018-08-24: 11:00:00 2 mg via INTRAVENOUS

## 2018-08-24 MED ORDER — PROMETHAZINE HCL 25 MG RE SUPP: 25.0000 mg | Freq: Four times a day (QID) | RECTAL | 1 refills | Status: DC | PRN

## 2018-08-24 MED ORDER — SCOPOLAMINE 1 MG/3DAYS TD PT72: 1.0000 | MEDICATED_PATCH | Freq: Once | TRANSDERMAL | Status: DC | PRN

## 2018-08-24 MED ORDER — OXYCODONE HCL 5 MG PO TABS
ORAL_TABLET | ORAL | Status: AC
  Filled 2018-08-24: qty 1

## 2018-08-24 MED ORDER — GABAPENTIN 300 MG PO CAPS
300.0000 mg | ORAL_CAPSULE | Freq: Once | ORAL | Status: AC
  Administered 2018-08-24: 300 mg via ORAL

## 2018-08-24 MED ORDER — FENTANYL CITRATE (PF) 100 MCG/2ML IJ SOLN
50.0000 ug | INTRAMUSCULAR | Status: DC | PRN
  Administered 2018-08-24: 11:00:00 100 ug via INTRAVENOUS

## 2018-08-24 MED ORDER — HYDROMORPHONE HCL 1 MG/ML IJ SOLN: 0.2500 mg | INTRAMUSCULAR | Status: DC | PRN

## 2018-08-24 MED ORDER — DEXAMETHASONE SODIUM PHOSPHATE 10 MG/ML IJ SOLN
INTRAMUSCULAR | Status: AC
  Filled 2018-08-24: qty 1

## 2018-08-24 MED ORDER — HYDROCODONE-ACETAMINOPHEN 7.5-325 MG PO TABS: 1.0000 | ORAL_TABLET | Freq: Four times a day (QID) | ORAL | 0 refills | Status: DC | PRN

## 2018-08-24 MED ORDER — ACETAMINOPHEN 10 MG/ML IV SOLN: 1000.0000 mg | Freq: Once | INTRAVENOUS | Status: DC | PRN

## 2018-08-24 MED ORDER — FENTANYL CITRATE (PF) 100 MCG/2ML IJ SOLN
INTRAMUSCULAR | Status: AC
  Filled 2018-08-24: qty 2

## 2018-08-24 MED ORDER — ACETAMINOPHEN 500 MG PO TABS
ORAL_TABLET | ORAL | Status: AC
  Filled 2018-08-24: qty 2

## 2018-08-24 MED ORDER — ONDANSETRON HCL 4 MG/2ML IJ SOLN
INTRAMUSCULAR | Status: DC | PRN
  Administered 2018-08-24: 4 mg via INTRAVENOUS

## 2018-08-24 MED ORDER — LIDOCAINE HCL (CARDIAC) PF 100 MG/5ML IV SOSY
PREFILLED_SYRINGE | INTRAVENOUS | Status: DC | PRN
  Administered 2018-08-24: 100 mg via INTRAVENOUS

## 2018-08-24 MED ORDER — SUCCINYLCHOLINE CHLORIDE 200 MG/10ML IV SOSY
PREFILLED_SYRINGE | INTRAVENOUS | Status: AC
  Filled 2018-08-24: qty 10

## 2018-08-24 MED ORDER — PROPOFOL 10 MG/ML IV BOLUS
INTRAVENOUS | Status: AC
  Filled 2018-08-24: qty 20

## 2018-08-24 MED ORDER — LIDOCAINE 2% (20 MG/ML) 5 ML SYRINGE
INTRAMUSCULAR | Status: AC
  Filled 2018-08-24: qty 5

## 2018-08-24 MED ORDER — DEXAMETHASONE SODIUM PHOSPHATE 4 MG/ML IJ SOLN
INTRAMUSCULAR | Status: DC | PRN
  Administered 2018-08-24: 10 mg via INTRAVENOUS

## 2018-08-24 MED ORDER — HYDROCODONE-ACETAMINOPHEN 7.5-325 MG PO TABS: 1.0000 | ORAL_TABLET | Freq: Once | ORAL | Status: DC | PRN

## 2018-08-24 MED ORDER — ACETAMINOPHEN 500 MG PO TABS
1000.0000 mg | ORAL_TABLET | Freq: Once | ORAL | Status: AC
  Administered 2018-08-24: 1000 mg via ORAL

## 2018-08-24 MED FILL — HYDROCODON-APAP 7.5-325: 7.5-325 | 5 days supply | Qty: 20 | Fill #0

## 2018-08-24 MED FILL — CLINDAMYCIN HCL 300 MG CAPS: 300 | 5 days supply | Qty: 15 | Fill #0

## 2018-08-24 SURGICAL SUPPLY — 29 items
BLADE SURG 15 STRL LF DISP TIS (BLADE) ×1 IMPLANT
BLADE SURG 15 STRL SS (BLADE) ×3
CANISTER SUCT 1200ML W/VALVE (MISCELLANEOUS) ×1 IMPLANT
COVER MAYO STAND REUSABLE (DRAPES) ×3 IMPLANT
COVER WAND RF STERILE (DRAPES) IMPLANT
DECANTER SPIKE VIAL GLASS SM (MISCELLANEOUS) ×3 IMPLANT
ELECT COATED BLADE 2.86 ST (ELECTRODE) IMPLANT
ELECT REM PT RETURN 9FT ADLT (ELECTROSURGICAL)
ELECTRODE REM PT RTRN 9FT ADLT (ELECTROSURGICAL) IMPLANT
GAUZE 4X4 16PLY RFD (DISPOSABLE) IMPLANT
GLOVE ECLIPSE 7.5 STRL STRAW (GLOVE) ×3 IMPLANT
GOWN STRL REUS W/ TWL LRG LVL3 (GOWN DISPOSABLE) ×1 IMPLANT
GOWN STRL REUS W/TWL LRG LVL3 (GOWN DISPOSABLE) ×3
MARKER SKIN DUAL TIP RULER LAB (MISCELLANEOUS) IMPLANT
NDL PRECISIONGLIDE 27X1.5 (NEEDLE) ×1 IMPLANT
NEEDLE PRECISIONGLIDE 27X1.5 (NEEDLE) ×3 IMPLANT
NS IRRIG 1000ML POUR BTL (IV SOLUTION) IMPLANT
PACK BASIN DAY SURGERY FS (CUSTOM PROCEDURE TRAY) ×3 IMPLANT
PENCIL FOOT CONTROL (ELECTRODE) IMPLANT
SCISSORS WIRE ANG 4 3/4 DISP (INSTRUMENTS) IMPLANT
SHEET MEDIUM DRAPE 40X70 STRL (DRAPES) ×3 IMPLANT
SUT CHROMIC 3 0 PS 2 (SUTURE) IMPLANT
SUT CHROMIC 4 0 PS 2 18 (SUTURE) IMPLANT
SYR CONTROL 10ML LL (SYRINGE) ×3 IMPLANT
TOWEL GREEN STERILE FF (TOWEL DISPOSABLE) ×6 IMPLANT
TRAY DSU PREP LF (CUSTOM PROCEDURE TRAY) IMPLANT
TUBE CONNECTING 20'X1/4 (TUBING) ×1
TUBE CONNECTING 20X1/4 (TUBING) ×2 IMPLANT
YANKAUER SUCT BULB TIP NO VENT (SUCTIONS) ×3 IMPLANT

## 2018-08-24 NOTE — Transfer of Care (Signed)
Immediate Anesthesia Transfer of Care Note  Patient: Ricky Graham  Procedure(s) Performed: MANDIBULAR HARDWARE REMOVAL (N/A Mouth)  Patient Location: PACU  Anesthesia Type:General  Level of Consciousness: sedated and responds to stimulation  Airway & Oxygen Therapy: Patient Spontanous Breathing and Patient connected to face mask oxygen  Post-op Assessment: Report given to RN and Post -op Vital signs reviewed and stable  Post vital signs: Reviewed and stable  Last Vitals:  Vitals Value Taken Time  BP 116/63 08/24/2018 11:03 AM  Temp    Pulse 80 08/24/2018 11:04 AM  Resp 12 08/24/2018 11:04 AM  SpO2 99 % 08/24/2018 11:04 AM  Vitals shown include unvalidated device data.  Last Pain:  Vitals:   08/24/18 1007  TempSrc: Oral  PainSc: 2       Patients Stated Pain Goal: 1 (08/24/18 1007)  Complications: No apparent anesthesia complications

## 2018-08-24 NOTE — Discharge Instructions (Signed)
°  Post Anesthesia Home Care Instructions  Activity: Get plenty of rest for the remainder of the day. A responsible individual must stay with you for 24 hours following the procedure.  For the next 24 hours, DO NOT: -Drive a car -Advertising copywriter -Drink alcoholic beverages -Take any medication unless instructed by your physician -Make any legal decisions or sign important papers.  Meals: Start with liquid foods such as gelatin or soup. Progress to regular foods as tolerated. Avoid greasy, spicy, heavy foods. If nausea and/or vomiting occur, drink only clear liquids until the nausea and/or vomiting subsides. Call your physician if vomiting continues.  Special Instructions/Symptoms: Your throat may feel dry or sore from the anesthesia or the breathing tube placed in your throat during surgery. If this causes discomfort, gargle with warm salt water. The discomfort should disappear within 24 hours.  If you had a scopolamine patch placed behind your ear for the management of post- operative nausea and/or vomiting:  1. The medication in the patch is effective for 72 hours, after which it should be removed.  Wrap patch in a tissue and discard in the trash. Wash hands thoroughly with soap and water. 2. You may remove the patch earlier than 72 hours if you experience unpleasant side effects which may include dry mouth, dizziness or visual disturbances. 3. Avoid touching the patch. Wash your hands with soap and water after contact with the patch.      Rinse mouth with salt water 3 times daily for 2 weeks.   Tylenol given at 1000 5/29;/20

## 2018-08-24 NOTE — Anesthesia Procedure Notes (Signed)
Procedure Name: LMA Insertion Date/Time: 08/24/2018 10:35 AM Performed by: Gar Gibbon, CRNA Pre-anesthesia Checklist: Patient identified, Emergency Drugs available, Suction available and Patient being monitored Patient Re-evaluated:Patient Re-evaluated prior to induction Oxygen Delivery Method: Circle system utilized Preoxygenation: Pre-oxygenation with 100% oxygen Induction Type: IV induction Ventilation: Mask ventilation without difficulty LMA: LMA with gastric port inserted LMA Size: 5.0 Number of attempts: 1 Airway Equipment and Method: Bite block Placement Confirmation: positive ETCO2 Tube secured with: Tape Dental Injury: Teeth and Oropharynx as per pre-operative assessment

## 2018-08-24 NOTE — Op Note (Signed)
OPERATIVE REPORT  DATE OF SURGERY: 08/24/2018  PATIENT:  Ricky Graham,  28 y.o. male  PRE-OPERATIVE DIAGNOSIS:  jaw fracture  POST-OPERATIVE DIAGNOSIS:  jaw fracture  PROCEDURE:  Procedure(s): MANDIBULAR HARDWARE REMOVAL  SURGEON:  Susy Frizzle, MD  ASSISTANTS: none  ANESTHESIA:   General   EBL:   40 ml  DRAINS: none  LOCAL MEDICATIONS USED:  1% Xylocaine with epinephrine  SPECIMEN:  none  COUNTS:  Correct  PROCEDURE DETAILS: The patient was taken to the operating room and placed on the operating table in the supine position. The MMF wires were cut and removed. Following induction of intravenous sedation, local anesthetic was infiltrated along the upper and lower gingival mucosa. All of the screws were identified, uncovered by mucosal overgrowth and removed with screwdriver. The upper and lower arch bar were then completely removed. The oral cavity was irrigated with saline and suctioned. The patient was awakened from sedation and transferred to recovery in stable condition.    PATIENT DISPOSITION:  To PACU, stable

## 2018-08-24 NOTE — Anesthesia Postprocedure Evaluation (Signed)
Anesthesia Post Note  Patient: Ricky Graham  Procedure(s) Performed: MANDIBULAR HARDWARE REMOVAL (N/A Mouth)     Patient location during evaluation: PACU Anesthesia Type: General Level of consciousness: awake and alert Pain management: pain level controlled Vital Signs Assessment: post-procedure vital signs reviewed and stable Respiratory status: spontaneous breathing, nonlabored ventilation, respiratory function stable and patient connected to nasal cannula oxygen Cardiovascular status: blood pressure returned to baseline and stable Postop Assessment: no apparent nausea or vomiting Anesthetic complications: no    Last Vitals:  Vitals:   08/24/18 1130 08/24/18 1145  BP: 124/90 109/81  Pulse: 73 94  Resp: 19 18  Temp:  36.6 C  SpO2: 100% 100%    Last Pain:  Vitals:   08/24/18 1145  TempSrc:   PainSc: 5                  Trevor Iha

## 2018-08-24 NOTE — Interval H&P Note (Signed)
History and Physical Interval Note:  08/24/2018 10:28 AM  Ricky Graham  has presented today for surgery, with the diagnosis of jaw fracture.  The various methods of treatment have been discussed with the patient and family. After consideration of risks, benefits and other options for treatment, the patient has consented to  Procedure(s): MANDIBULAR HARDWARE REMOVAL (N/A) as a surgical intervention.  The patient's history has been reviewed, patient examined, no change in status, stable for surgery.  I have reviewed the patient's chart and labs.  Questions were answered to the patient's satisfaction.     Serena Colonel

## 2018-08-28 ENCOUNTER — Encounter (HOSPITAL_BASED_OUTPATIENT_CLINIC_OR_DEPARTMENT_OTHER): Payer: Self-pay | Admitting: Otolaryngology

## 2018-12-13 ENCOUNTER — Emergency Department (HOSPITAL_COMMUNITY)
Admission: EM | Admit: 2018-12-13 | Discharge: 2018-12-13 | Disposition: A | Discharge status: Home / Self Care | Payer: Self-pay

## 2020-07-31 ENCOUNTER — Emergency Department (HOSPITAL_COMMUNITY)
Admission: EM | Admit: 2020-07-31 | Discharge: 2020-07-31 | Disposition: A | Discharge status: Home / Self Care | Payer: Medicaid Other | Attending: Emergency Medicine | Admitting: Emergency Medicine

## 2020-07-31 ENCOUNTER — Other Ambulatory Visit (HOSPITAL_COMMUNITY): Payer: Self-pay

## 2020-07-31 ENCOUNTER — Other Ambulatory Visit: Payer: Self-pay

## 2020-07-31 ENCOUNTER — Encounter (HOSPITAL_COMMUNITY): Payer: Self-pay

## 2020-07-31 DIAGNOSIS — K2970 Gastritis, unspecified, without bleeding: Secondary | ICD-10-CM | POA: Insufficient documentation

## 2020-07-31 DIAGNOSIS — R112 Nausea with vomiting, unspecified: Secondary | ICD-10-CM

## 2020-07-31 DIAGNOSIS — F17200 Nicotine dependence, unspecified, uncomplicated: Secondary | ICD-10-CM | POA: Insufficient documentation

## 2020-07-31 LAB — CBC WITH DIFFERENTIAL/PLATELET
Abs Immature Granulocytes: 0.01 10*3/uL (ref 0.00–0.07)
Basophils Absolute: 0.1 10*3/uL (ref 0.0–0.1)
Basophils Relative: 1 %
Eosinophils Absolute: 0 10*3/uL (ref 0.0–0.5)
Eosinophils Relative: 0 %
HCT: 47.7 % (ref 39.0–52.0)
Hemoglobin: 15.7 g/dL (ref 13.0–17.0)
Immature Granulocytes: 0 %
Lymphocytes Relative: 21 %
Lymphs Abs: 1.4 10*3/uL (ref 0.7–4.0)
MCH: 28.4 pg (ref 26.0–34.0)
MCHC: 32.9 g/dL (ref 30.0–36.0)
MCV: 86.4 fL (ref 80.0–100.0)
Monocytes Absolute: 0.5 10*3/uL (ref 0.1–1.0)
Monocytes Relative: 7 %
Neutro Abs: 4.7 10*3/uL (ref 1.7–7.7)
Neutrophils Relative %: 71 %
Platelets: 341 10*3/uL (ref 150–400)
RBC: 5.52 MIL/uL (ref 4.22–5.81)
RDW: 13 % (ref 11.5–15.5)
WBC: 6.6 10*3/uL (ref 4.0–10.5)
nRBC: 0 % (ref 0.0–0.2)

## 2020-07-31 LAB — COMPREHENSIVE METABOLIC PANEL
ALT: 19 U/L (ref 0–44)
AST: 23 U/L (ref 15–41)
Albumin: 4.4 g/dL (ref 3.5–5.0)
Alkaline Phosphatase: 111 U/L (ref 38–126)
Anion gap: 8 (ref 5–15)
BUN: 12 mg/dL (ref 6–20)
CO2: 29 mmol/L (ref 22–32)
Calcium: 9.7 mg/dL (ref 8.9–10.3)
Chloride: 101 mmol/L (ref 98–111)
Creatinine, Ser: 1.07 mg/dL (ref 0.61–1.24)
GFR, Estimated: >60 mL/min (ref >60)
Glucose, Bld: 94 mg/dL (ref 70–99)
Potassium: 4.4 mmol/L (ref 3.5–5.1)
Sodium: 138 mmol/L (ref 135–145)
Total Bilirubin: 1.4 mg/dL — ABNORMAL HIGH (ref 0.3–1.2)
Total Protein: 7.6 g/dL (ref 6.5–8.1)

## 2020-07-31 LAB — URINALYSIS, ROUTINE W REFLEX MICROSCOPIC
Bilirubin Urine: NEGATIVE
Glucose, UA: NEGATIVE mg/dL
Hgb urine dipstick: NEGATIVE
Ketones, ur: 5 mg/dL — AB
Leukocytes,Ua: NEGATIVE
Nitrite: NEGATIVE
Protein, ur: 100 mg/dL — AB
Specific Gravity, Urine: 1.026 (ref 1.005–1.030)
pH: 7 (ref 5.0–8.0)

## 2020-07-31 LAB — CBG MONITORING, ED: Glucose-Capillary: 96 mg/dL (ref 70–99)

## 2020-07-31 LAB — LIPASE, BLOOD: Lipase: 23 U/L (ref 11–51)

## 2020-07-31 MED ORDER — PANTOPRAZOLE SODIUM 40 MG PO TBEC
40.0000 mg | DELAYED_RELEASE_TABLET | Freq: Once | ORAL | Status: AC
  Administered 2020-07-31: 40 mg via ORAL
  Filled 2020-07-31: qty 1

## 2020-07-31 MED ORDER — PANTOPRAZOLE SODIUM 20 MG PO TBEC
20.0000 mg | DELAYED_RELEASE_TABLET | Freq: Every day | ORAL | 0 refills | Status: AC
  Filled 2020-07-31: qty 30, 30d supply, fill #0

## 2020-07-31 MED ORDER — FAMOTIDINE IN NACL 20-0.9 MG/50ML-% IV SOLN
20.0000 mg | INTRAVENOUS | Status: DC
  Filled 2020-07-31 (×2): qty 50

## 2020-07-31 MED ORDER — ALUM & MAG HYDROXIDE-SIMETH 200-200-20 MG/5ML PO SUSP
30.0000 mL | Freq: Once | ORAL | Status: AC
  Administered 2020-07-31: 30 mL via ORAL
  Filled 2020-07-31: qty 30

## 2020-07-31 MED ORDER — ONDANSETRON 4 MG PO TBDP
4.0000 mg | ORAL_TABLET | Freq: Once | ORAL | Status: AC
  Administered 2020-07-31: 4 mg via ORAL
  Filled 2020-07-31: qty 1

## 2020-07-31 MED ORDER — LIDOCAINE VISCOUS HCL 2 % MT SOLN
15.0000 mL | Freq: Once | OROMUCOSAL | Status: AC
  Administered 2020-07-31: 15 mL via ORAL
  Filled 2020-07-31: qty 15

## 2020-07-31 MED ORDER — FAMOTIDINE 20 MG PO TABS: 20.0000 mg | ORAL_TABLET | Freq: Once | ORAL | Status: DC

## 2020-07-31 MED ORDER — FAMOTIDINE IN NACL 20-0.9 MG/50ML-% IV SOLN
20.0000 mg | Freq: Once | INTRAVENOUS | Status: AC
  Administered 2020-07-31: 20 mg via INTRAVENOUS
  Filled 2020-07-31: qty 50

## 2020-07-31 MED ORDER — ALUM & MAG HYDROXIDE-SIMETH 400-400-40 MG/5ML PO SUSP: 15.0000 mL | Freq: Four times a day (QID) | ORAL | 0 refills | Status: AC | PRN

## 2020-07-31 MED ORDER — FAMOTIDINE 20 MG PO TABS
20.0000 mg | ORAL_TABLET | Freq: Two times a day (BID) | ORAL | 0 refills | Status: AC
  Filled 2020-07-31: qty 14, 7d supply, fill #0

## 2020-07-31 NOTE — ED Notes (Signed)
Pt states his abdomen is hurting. Feels faint. Pr currently laying down on recliner in triage.

## 2020-07-31 NOTE — ED Triage Notes (Signed)
Abdominal pain x 3 days and vomiting x 3 episodes today. Also reports diarrhea.

## 2020-07-31 NOTE — ED Provider Notes (Signed)
[facility] EMERGENCY DEPARTMENT Provider Note   CSN: [id number] Arrival date & time: 07/31/20  0007     History Chief Complaint  Patient presents with  . Abdominal Pain  . Emesis    Ricky Graham is a 30 y.o. male.   Abdominal Pain Pain location:  Epigastric Pain quality: aching and sharp   Pain radiates to:  Does not radiate Pain severity:  Mild Onset quality:  Gradual Timing:  Intermittent Chronicity:  Recurrent Context: alcohol use, diet changes, eating and suspicious food intake   Context: not recent travel and not sick contacts   Relieved by:  Vomiting Worsened by:  Eating      Past Medical History:  Diagnosis Date  . Closed fracture of shaft of left clavicle 01/12/2018    Patient Active Problem List   Diagnosis Date Noted  . Closed fracture of shaft of left clavicle 01/12/2018  . Pelvic ring fracture (HCC) 01/12/2018  . 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 Open mandibular fracture (HCC) 01/06/2018  . Trauma 01/06/2018  . Rhabdomyolysis 06/21/2017    Past Surgical History:  Procedure Laterality Date  . LACERATION REPAIR N/A 01/06/2018   Procedure: REPAIR MULTIPLE LACERATIONS & MMF;  Surgeon: Serena Colonel, MD;  Location: [facility] OR;  Service: ENT;  Laterality: N/A;  . MANDIBULAR HARDWARE REMOVAL N/A 08/24/2018   Procedure: MANDIBULAR HARDWARE REMOVAL;  Surgeon: Serena Colonel, MD;  Location: [facility];  Service: ENT;  Laterality: N/A;  . ORIF CLAVICULAR FRACTURE Left 01/09/2018   Procedure: OPEN REDUCTION INTERNAL FIXATION (ORIF) CLAVICULAR FRACTURE;  Surgeon: Myrene Galas, MD;  Location: [facility] OR;  Service: Orthopedics;  Laterality: Left;  . ORIF MANDIBULAR FRACTURE N/A 01/06/2018   Procedure: OPEN REDUCTION INTERNAL FIXATION (ORIF) MANDIBULAR FRACTURE;  Surgeon: Serena Colonel, MD;  Location: [facility] OR;  Service: ENT;  Laterality: N/A;       Family History  Problem Relation Age of Onset  . Hypertension Mother      Social History   Tobacco Use  . Smoking status: Current Every Day Smoker  . Smokeless tobacco: Never Used  Substance Use Topics  . Alcohol use: Yes  . Drug use: No    Home Medications Prior to Admission medications   Medication Sig Start Date End Date Taking? Authorizing Provider  alum & mag hydroxide-simeth (MAALOX ADVANCED MAX ST) 400-400-40 MG/5ML suspension Take 15 mLs by mouth every 6 (six) hours as needed for indigestion. 07/31/20  Yes Dominion Kathan, Barbara Cower, MD  famotidine (PEPCID) 20 MG tablet Take 1 tablet (20 mg total) by mouth 2 (two) times daily. 07/31/20  Yes Samyukta Cura, Barbara Cower, MD  pantoprazole (PROTONIX) 20 MG tablet Take 1 tablet (20 mg total) by mouth daily. 07/31/20  Yes Jazelle Achey, Barbara Cower, MD  clindamycin (CLEOCIN) 300 MG capsule Take 1 capsule (300 mg total) by mouth 3 (three) times daily. 08/24/18   Serena Colonel, MD  HYDROcodone-acetaminophen (NORCO) 7.5-325 MG tablet Take 1 tablet by mouth every 6 (six) hours as needed for moderate pain. 08/24/18   Serena Colonel, MD  promethazine (PHENERGAN) 25 MG suppository Place 1 suppository (25 mg total) rectally every 6 (six) hours as needed for nausea or vomiting. 08/24/18   Serena Colonel, MD    Allergies    Patient has no known allergies.  Review of Systems   Review of Systems  Gastrointestinal: Positive for abdominal pain.  All other systems reviewed and are negative.   Physical Exam Updated Vital Signs BP 128/77 (BP Location: Left Arm)  Pulse (!) 59   Temp 98.4 F (36.9 C) (Oral)   Resp 18   Ht 6' (1.829 m)   Wt 78 kg   SpO2 97%   BMI 23.32 kg/m   Physical Exam Vitals and nursing note reviewed.  Constitutional:      Appearance: He is well-developed.  HENT:     Head: Normocephalic and atraumatic.     Mouth/Throat:     Mouth: Mucous membranes are moist.  Eyes:     Pupils: Pupils are equal, round, and reactive to light.  Cardiovascular:     Rate and Rhythm: Normal rate.  Pulmonary:     Effort: Pulmonary effort is normal.  No respiratory distress.  Abdominal:     General: Abdomen is flat. There is no distension.     Tenderness: There is no abdominal tenderness.     Hernia: No hernia is present.  Musculoskeletal:        General: Normal range of motion.     Cervical back: Normal range of motion.  Skin:    General: Skin is warm.  Neurological:     General: No focal deficit present.     Mental Status: He is alert.     ED Results / Procedures / Treatments   Labs (all labs ordered are listed, but only abnormal results are displayed) Labs Reviewed  COMPREHENSIVE METABOLIC PANEL - Abnormal; Notable for the following components:      Result Value   Total Bilirubin 1.4 (*)    All other components within normal limits  URINALYSIS, ROUTINE W REFLEX MICROSCOPIC - Abnormal; Notable for the following components:   Color, Urine AMBER (*)    APPearance HAZY (*)    Ketones, ur 5 (*)    Protein, ur 100 (*)    Bacteria, UA MANY (*)    All other components within normal limits  CBC WITH DIFFERENTIAL/PLATELET  LIPASE, BLOOD  CBG MONITORING, ED    EKG None  Radiology No results found.  Procedures Procedures   Medications Ordered in ED Medications  famotidine (PEPCID) IVPB 20 mg premix (has no administration in time range)  ondansetron (ZOFRAN-ODT) disintegrating tablet 4 mg (4 mg Oral Given 07/31/20 0021)  ondansetron (ZOFRAN-ODT) disintegrating tablet 4 mg (4 mg Oral Given 07/31/20 0247)  alum & mag hydroxide-simeth (MAALOX/MYLANTA) 200-200-20 MG/5ML suspension 30 mL (30 mLs Oral Given 07/31/20 0358)    And  lidocaine (XYLOCAINE) 2 % viscous mouth solution 15 mL (15 mLs Oral Given 07/31/20 0358)  pantoprazole (PROTONIX) EC tablet 40 mg (40 mg Oral Given 07/31/20 7893)    ED Course  I have reviewed the triage vital signs and the nursing notes.  Pertinent labs & imaging results that were available during my care of the patient were reviewed by me and considered in my medical decision making (see chart for  details).    MDM Rules/Calculators/A&P                          Mild dehydration not requiring IVF. Given multiple acid reducers for gastritis. Subsequently able to tolerate PO (multiple crackers, fluids, peanut butter, etc.) but requesting sandwich prior to discharge.   Final Clinical Impression(s) / ED Diagnoses Final diagnoses:  Non-intractable vomiting with nausea, unspecified vomiting type  Gastritis without bleeding, unspecified chronicity, unspecified gastritis type    Rx / DC Orders ED Discharge Orders         Ordered    pantoprazole (PROTONIX) 20 MG tablet  Daily        07/31/20 0704    famotidine (PEPCID) 20 MG tablet  2 times daily        07/31/20 0704    alum & mag hydroxide-simeth (MAALOX ADVANCED MAX ST) 400-400-40 MG/5ML suspension  Every 6 hours PRN        07/31/20 0704           Azrael Maddix, Barbara Cower, MD 07/31/20 1907

## 2020-07-31 NOTE — ED Provider Notes (Signed)
MSE was initiated and I personally evaluated the patient and placed orders (if any) at  12:21 AM on Jul 31, 2020.  Patient here with abdominal pain x3 days.  He states that it is in the epigastric region.  Reports associated vomiting.  States that it feels like bad gas.  He also reports having diarrhea.  Vital signs are stable.  Alert and oriented No respiratory distress No focal abdominal tenderness  Discussed with patient that their care has been initiated.   They are counseled that they will need to remain in the ED until the completion of their workup, including full H&P and results of any tests.  Risks of leaving the emergency department prior to completion of treatment were discussed. Patient was advised to inform ED staff if they are leaving before their treatment is complete. The patient acknowledged these risks and time was allowed for questions.    The patient appears stable so that the remainder of the MSE may be completed by another provider.    Roxy Horseman, PA-C 07/31/20 Annia Belt, MD 07/31/20 [phone]

## 2020-07-31 NOTE — ED Notes (Signed)
Pt complains to this RN about the wait time. RN explained that his complaints are being addressed, thus getting oral meds while in triage. Still waiting for a room for this pt.

## 2020-08-10 ENCOUNTER — Other Ambulatory Visit (HOSPITAL_COMMUNITY): Payer: Self-pay

## 2021-01-09 ENCOUNTER — Other Ambulatory Visit: Payer: Self-pay

## 2021-01-09 ENCOUNTER — Emergency Department (HOSPITAL_COMMUNITY)
Admission: EM | Admit: 2021-01-09 | Discharge: 2021-01-10 | Disposition: A | Discharge status: Home / Self Care | Payer: Self-pay | Attending: Emergency Medicine | Admitting: Emergency Medicine

## 2021-01-09 ENCOUNTER — Encounter (HOSPITAL_COMMUNITY): Payer: Self-pay | Admitting: *Deleted

## 2021-01-09 DIAGNOSIS — R369 Urethral discharge, unspecified: Secondary | ICD-10-CM | POA: Insufficient documentation

## 2021-01-09 DIAGNOSIS — R82998 Other abnormal findings in urine: Secondary | ICD-10-CM | POA: Insufficient documentation

## 2021-01-09 DIAGNOSIS — F17200 Nicotine dependence, unspecified, uncomplicated: Secondary | ICD-10-CM | POA: Insufficient documentation

## 2021-01-09 LAB — URINALYSIS, ROUTINE W REFLEX MICROSCOPIC
Bilirubin Urine: NEGATIVE
Glucose, UA: NEGATIVE mg/dL
Hgb urine dipstick: NEGATIVE
Ketones, ur: NEGATIVE mg/dL
Nitrite: NEGATIVE
Protein, ur: 30 mg/dL — AB
Specific Gravity, Urine: 1.021 (ref 1.005–1.030)
pH: 8 (ref 5.0–8.0)

## 2021-01-09 LAB — HIV ANTIBODY (ROUTINE TESTING W REFLEX): HIV Screen 4th Generation wRfx: NONREACTIVE

## 2021-01-09 NOTE — ED Triage Notes (Signed)
Pt noted penile discharge.

## 2021-01-09 NOTE — ED Provider Notes (Signed)
Emergency Medicine Provider Triage Evaluation Note  Ricky Graham , a 30 y.o. male  was evaluated in triage.  Pt complains of penile discharge that he noticed yesterday. Also complains of dysuria. Sexually active with 1 male partner. No testicular pain or swelling.   Review of Systems  Positive: + penile discharge, dysuria Negative: - abd pain  Physical Exam  BP 118/76 (BP Location: Left Arm)   Pulse 66   Temp 98.3 F (36.8 C) (Oral)   Resp 15   SpO2 100%  Gen:   Awake, no distress   Resp:  Normal effort  MSK:   Moves extremities without difficulty  Other:  GU exam deferred in triage  Medical Decision Making  Medically screening exam initiated at 8:47 PM.  Appropriate orders placed.  Ricky Graham was informed that the remainder of the evaluation will be completed by another provider, this initial triage assessment does not replace that evaluation, and the importance of remaining in the ED until their evaluation is complete.     Tanda Rockers, PA-C 01/09/21 2053    Benjiman Core, MD 01/10/21 2025

## 2021-01-10 LAB — RPR: RPR Ser Ql: NONREACTIVE

## 2021-01-10 MED ORDER — DOXYCYCLINE HYCLATE 100 MG PO TABS: 100.0000 mg | ORAL_TABLET | Freq: Two times a day (BID) | ORAL | 0 refills | Status: AC

## 2021-01-10 MED ORDER — DOXYCYCLINE HYCLATE 100 MG PO TABS: 100.0000 mg | ORAL_TABLET | Freq: Two times a day (BID) | ORAL | 0 refills | Status: DC

## 2021-01-10 MED ORDER — LIDOCAINE HCL (PF) 1 % IJ SOLN
1.0000 mL | Freq: Once | INTRAMUSCULAR | Status: AC
  Administered 2021-01-10: 1 mL

## 2021-01-10 MED ORDER — LIDOCAINE HCL (PF) 1 % IJ SOLN
INTRAMUSCULAR | Status: AC
  Filled 2021-01-10: qty 5

## 2021-01-10 MED ORDER — CEFTRIAXONE SODIUM 500 MG IJ SOLR
500.0000 mg | Freq: Once | INTRAMUSCULAR | Status: AC
  Administered 2021-01-10: 500 mg via INTRAMUSCULAR
  Filled 2021-01-10: qty 500

## 2021-01-10 NOTE — Discharge Instructions (Addendum)
You have been treated in the emergency department for an infection, possibly sexually transmitted. Results of your gonorrhea and chlamydia tests are pending and you will be notified if they are positive. Please refrain from intercourse for 7 days and until all sex partners (within previous 60 days) are evaluated and/or treated as well. Please follow up with your primary care provider for continued care and further STD evaluation.  It is very important to practice safe sex and use condoms when sexually active. If your results are positive you need to notify all sexual partners so they can be treated as well. The website http://www.dontspreadit.com/ can be used to send anonymous text messages or emails to alert sexual contacts. Follow up with your doctor, or OBGYN in regards to today's visit.   Gonorrhea and Chlamydia SYMPTOMS  In females, symptoms may go unnoticed. Symptoms that are more noticeable can include:  Belly (abdominal) pain.  Painful intercourse.  Watery mucous-like discharge from the vagina.  Miscarriage.  Discomfort when urinating.  Inflammation of the rectum.  Abnormal gray-green frothy vaginal discharge  Vaginal itching and irritatio  Itching and irritation of the area outside the vagina.   Painful urination.  Bleeding after sexual intercourse.  In males, symptoms include:  Burning with urination.  Pain in the testicles.  Watery mucous-like discharge from the penis.  It can cause longstanding (chronic) pelvic pain after frequent infections.  TREATMENT  PID can cause women to not be able to have children (sterile) if left untreated or if half-treated.  It is important to finish ALL medications given to you.  This is a sexually transmitted infection. So you are also at risk for other sexually transmitted diseases, including HIV (AIDS), it is recommended that you get tested. HOME CARE INSTRUCTIONS  Warning: This infection is contagious. Do not have sex until treatment is  completed. Follow up at your caregiver's office or the clinic to which you were referred. If your diagnosis (learning what is wrong) is confirmed by culture or some other method, your recent sexual contacts need treatment. Even if they are symptom free or have a negative culture or evaluation, they should be treated.  PREVENTION  Women should use sanitary pads instead of tampons for vaginal discharge.  Wipe front to back after using the toilet and avoid douching.   Practice safe sex, use condoms, have only one sex partner and be sure your sex partner is not having sex with others.  Ask your caregiver to test you for chlamydia at your regular checkups or sooner if you are having symptoms.  Ask for further information if you are pregnant.  SEEK IMMEDIATE MEDICAL CARE IF:  You develop an oral temperature above 102 F (38.9 C), not controlled by medications or lasting more than 2 days.  You develop an increase in pain.  You develop any type of abnormal discharge.  You develop vaginal bleeding and it is not time for your period.  You develop painful intercourse.   Bacterial Vaginosis  Bacterial vaginosis (BV) is a vaginal infection where the normal balance of bacteria in the vagina is disrupted. This is not a sexually transmitted disease and your sexual partners do NOT need to be treated. CAUSES  The cause of BV is not fully understood. BV develops when there is an increase or imbalance of harmful bacteria.  Some activities or behaviors can upset the normal balance of bacteria in the vagina and put women at increased risk including:  Having a new sex partner or multiple   sex partners.  Douching.  Using an intrauterine device (IUD) for contraception.  It is not clear what role sexual activity plays in the development of BV. However, women that have never had sexual intercourse are rarely infected with BV.  Women do not get BV from toilet seats, bedding, swimming pools or from touching objects around  them.   SYMPTOMS  Grey vaginal discharge.  A fish-like odor with discharge, especially after sexual intercourse.  Itching or burning of the vagina and vulva.  Burning or pain with urination.  Some women have no signs or symptoms at all.   TREATMENT  Sometimes BV will clear up without treatment.  BV may be treated with antibiotics.  BV can recur after treatment. If this happens, a second round of antibiotics will often be prescribed.  HOME CARE INSTRUCTIONS  Finish all medication as directed by your caregiver.  Do not have sex until treatment is completed.  Do NOT drink any alcoholic beverages while being treated  with Metronidazole (Flagyl). This will cause a severe reaction inducing vomiting.  RESOURCE GUIDE  Dental Problems  Patients with Medicaid: [facility]                     [facility] [address]                                           [address] Phone:  [phone]                                                  Phone:  [phone]  If unable to pay or uninsured, contact:  [facility] or [facility] to become qualified for the adult dental clinic.  Chronic Pain Problems Contact [facility]  [phone] Patients need to be referred by their primary care doctor.  Insufficient Money for Medicine Contact [employer]:  call "211" or [facility] 271-5999.  No Primary Care Doctor Call [facility]  832-8000 Other agencies that provide inexpensive medical care    [facility]  832-8035    [facility]  832-7272    [facility]  [phone]    [facility] Clinic  832-4777    [facility]  [phone]    [facility]  [phone]  Psychological Services [facility]  [phone] [facility]  [phone] [facility]   800 853-5163 (emergency services 641-4993)  Substance Abuse Resources [facility]   336-882-2125 [facility] 336-784-9470 [facility] 336-285-9073 [facility] 336-845-3988 Residential & Outpatient Substance Abuse Program  800-659-3381  Abuse/Neglect [place] Child Abuse Hotline (336) 641-3795 [place] Child Abuse Hotline 800-378-5315 (After Hours)  Emergency Shelter [facility] (336) 271-5985  [facility] (336) 275-9566 [facility] (704) 372-4663  MRSA Hotline #:   832-7006    [place] Resources  [facility] [place]     [employer]                          [facility] [address]   [place]                       [address]      [address]  [place]                                                [place]                            [place] Phone:  [phone]                                   Phone:  [phone]                 Phone:  [phone]  [facility] Phone:  [phone]  [place] Child Abuse Hotline (336) 342-1394 (336) 342-3537 (After Hours)   

## 2021-01-10 NOTE — ED Notes (Signed)
Pt asked to speak to PA again regarding his ear.

## 2021-01-10 NOTE — ED Provider Notes (Signed)
[facility] EMERGENCY DEPARTMENT Provider Note   CSN: [id number] Arrival date & time: 01/09/21  1958     History Chief Complaint  Patient presents with   SEXUALLY TRANSMITTED DISEASE    Ricky Graham is a 30 y.o. male.  HPI Patient is a 30 year old male presenting to the emergency room with complaints of penile discharge for approximately 48 hours now.  He is sexually active with 1 partner.  States male partners only.  He states that he has had some pinching in his penis and yellowish discharge from the tip of his penis denies any chest pain nausea vomiting lightheadedness dizziness abdominal pain.  He states he also has some small bumps at the base of the shaft of his penis.  He has not been tested for STDs recently.  No other associate symptoms.  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   Open mandibular fracture (HCC) 01/06/2018   Trauma 01/06/2018   Rhabdomyolysis 06/21/2017    Past Surgical History:  Procedure Laterality Date   LACERATION REPAIR N/A 01/06/2018   Procedure: REPAIR MULTIPLE LACERATIONS & MMF;  Surgeon: Serena Colonel, MD;  Location: [facility] OR;  Service: ENT;  Laterality: N/A;   MANDIBULAR HARDWARE REMOVAL N/A 08/24/2018   Procedure: MANDIBULAR HARDWARE REMOVAL;  Surgeon: Serena Colonel, MD;  Location: [facility];  Service: ENT;  Laterality: N/A;   ORIF CLAVICULAR FRACTURE Left 01/09/2018   Procedure: OPEN REDUCTION INTERNAL FIXATION (ORIF) CLAVICULAR FRACTURE;  Surgeon: Myrene Galas, MD;  Location: [facility] OR;  Service: Orthopedics;  Laterality: Left;   ORIF MANDIBULAR FRACTURE N/A 01/06/2018   Procedure: OPEN REDUCTION INTERNAL  FIXATION (ORIF) MANDIBULAR FRACTURE;  Surgeon: Serena Colonel, MD;  Location: [facility] OR;  Service: ENT;  Laterality: N/A;       Family History  Problem Relation Age of Onset   Hypertension Mother     Social History   Tobacco Use   Smoking status: Every Day   Smokeless tobacco: Never  Substance Use Topics   Alcohol use: Yes   Drug use: No    Home Medications Prior to Admission medications   Medication Sig Start Date End Date Taking? Authorizing Provider  alum & mag hydroxide-simeth (MAALOX ADVANCED MAX ST) 400-400-40 MG/5ML suspension Take 15 mLs by mouth every 6 (six) hours as needed for indigestion. 07/31/20   Mesner, Barbara Cower, MD  clindamycin (CLEOCIN) 300 MG capsule Take 1 capsule (300 mg total) by mouth 3 (three) times daily. 08/24/18   Serena Colonel, MD  doxycycline (VIBRA-TABS) 100 MG tablet Take 1 tablet (100 mg total) by mouth 2 (two) times daily for 7 days. 01/10/21 01/17/21  Gailen Shelter, PA  famotidine (PEPCID) 20 MG tablet Take 1 tablet (20 mg total) by mouth 2 (two) times daily. 07/31/20   Mesner, Barbara Cower, MD  HYDROcodone-acetaminophen (NORCO) 7.5-325 MG tablet Take 1 tablet by mouth every 6 (six) hours as needed for moderate pain. 08/24/18   Serena Colonel, MD  pantoprazole (PROTONIX) 20 MG tablet Take 1 tablet (20 mg total) by mouth daily. 07/31/20   Mesner, Barbara Cower, MD  promethazine (PHENERGAN) 25 MG suppository Place 1 suppository (25 mg total) rectally every 6 (six) hours  as needed for nausea or vomiting. 08/24/18   Serena Colonel, MD    Allergies    Patient has no known allergies.  Review of Systems   Review of Systems  Constitutional:  Negative for fever.  HENT:  Negative for congestion.   Respiratory:  Negative for shortness of breath.   Cardiovascular:  Negative for chest pain.  Gastrointestinal:  Negative for abdominal distention.  Genitourinary:  Positive for penile discharge.       Warts on the base of the penis  Neurological:  Negative for dizziness and headaches.    Physical Exam Updated Vital Signs BP 124/66 (BP Location: Right Arm)   Pulse (!) 106   Temp 99 F (37.2 C)   Resp 18   SpO2 97%   Physical Exam Vitals and nursing note reviewed.  Constitutional:      General: He is not in acute distress.    Appearance: Normal appearance. He is not ill-appearing.  HENT:     Head: Normocephalic and atraumatic.  Eyes:     General: No scleral icterus.       Right eye: No discharge.        Left eye: No discharge.     Conjunctiva/sclera: Conjunctivae normal.  Pulmonary:     Effort: Pulmonary effort is normal.     Breath sounds: No stridor.  Genitourinary:    Comments: Yellowish penile discharge from urethra.  Small papular verrucous appearing lesions to the base of the shaft of the penis on the pubis. Neurological:     Mental Status: He is alert and oriented to person, place, and time. Mental status is at baseline.    ED Results / Procedures / Treatments   Labs (all labs ordered are listed, but only abnormal results are displayed) Labs Reviewed  URINALYSIS, ROUTINE W REFLEX MICROSCOPIC - Abnormal; Notable for the following components:      Result Value   Protein, ur 30 (*)    Leukocytes,Ua SMALL (*)    Bacteria, UA RARE (*)    All other components within normal limits  HIV ANTIBODY (ROUTINE TESTING W REFLEX)  RPR  GC/CHLAMYDIA PROBE AMP ([place]) NOT AT [facility]    EKG None  Radiology No results found.  Procedures Procedures   Medications Ordered in ED Medications  cefTRIAXone (ROCEPHIN) injection 500 mg (has no administration in time range)  lidocaine (PF) (XYLOCAINE) 1 % injection 1 mL (has no administration in time range)  lidocaine (PF) (XYLOCAINE) 1 % injection (has no administration in time range)    ED Course  I have reviewed the triage vital signs and the nursing notes.  Pertinent labs & imaging results that were available during my care of the patient were reviewed by me and considered in my medical decision  making (see chart for details).    MDM Rules/Calculators/A&P                           Patient is 30 year old male presented today with yellowish urethral discharge.  Tested negative for HIV syphilis pending.  Gonorrhea collected and pending.  Urinalysis notable for rare bacteria small leukocytes.  Suspect gonorrhea or chlamydia we will treat empirically.  Lengthy discussion about STD testing and safety of sex.  Verrucous appearing lesions at base of shaft of penis.  Suspect condyloma versus HPV.  He will follow-up with the health department and PCP.  Final Clinical Impression(s) / ED Diagnoses Final diagnoses:  Penile discharge  Rx / DC Orders ED Discharge Orders          Ordered    doxycycline (VIBRA-TABS) 100 MG tablet  2 times daily,   Status:  Discontinued        01/10/21 0809    doxycycline (VIBRA-TABS) 100 MG tablet  2 times daily,   Status:  Discontinued        01/10/21 0814    doxycycline (VIBRA-TABS) 100 MG tablet  2 times daily        01/10/21 0818             Gailen Shelter, [place] 01/10/21 3086    Margarita Grizzle, MD 01/10/21 1420

## 2021-01-10 NOTE — ED Notes (Signed)
Pt to be discharged at triage by PA

## 2021-07-30 ENCOUNTER — Encounter (HOSPITAL_COMMUNITY): Payer: Self-pay | Admitting: *Deleted

## 2022-01-11 ENCOUNTER — Emergency Department (HOSPITAL_COMMUNITY)
Admission: EM | Admit: 2022-01-11 | Discharge: 2022-01-11 | Disposition: A | Discharge status: Home / Self Care | Payer: Self-pay | Attending: Student | Admitting: Student

## 2022-01-11 ENCOUNTER — Emergency Department (HOSPITAL_BASED_OUTPATIENT_CLINIC_OR_DEPARTMENT_OTHER): Payer: Self-pay

## 2022-01-11 ENCOUNTER — Encounter (HOSPITAL_COMMUNITY): Payer: Self-pay | Admitting: Emergency Medicine

## 2022-01-11 ENCOUNTER — Other Ambulatory Visit: Payer: Self-pay

## 2022-01-11 DIAGNOSIS — L02415 Cutaneous abscess of right lower limb: Secondary | ICD-10-CM | POA: Insufficient documentation

## 2022-01-11 DIAGNOSIS — K0889 Other specified disorders of teeth and supporting structures: Secondary | ICD-10-CM | POA: Insufficient documentation

## 2022-01-11 DIAGNOSIS — R52 Pain, unspecified: Secondary | ICD-10-CM

## 2022-01-11 MED ORDER — CLINDAMYCIN HCL 150 MG PO CAPS: 300.0000 mg | ORAL_CAPSULE | Freq: Four times a day (QID) | ORAL | 0 refills | Status: AC

## 2022-01-11 MED ORDER — IBUPROFEN 800 MG PO TABS
800.0000 mg | ORAL_TABLET | Freq: Once | ORAL | Status: AC
  Administered 2022-01-11: 800 mg via ORAL
  Filled 2022-01-11: qty 1

## 2022-01-11 MED ORDER — LIDOCAINE-EPINEPHRINE (PF) 2 %-1:200000 IJ SOLN
10.0000 mL | Freq: Once | INTRAMUSCULAR | Status: AC
  Administered 2022-01-11: 10 mL
  Filled 2022-01-11: qty 20

## 2022-01-11 MED ORDER — OXYCODONE-ACETAMINOPHEN 5-325 MG PO TABS
1.0000 | ORAL_TABLET | Freq: Once | ORAL | Status: AC
  Administered 2022-01-11: 1 via ORAL
  Filled 2022-01-11: qty 1

## 2022-01-11 MED ORDER — IBUPROFEN 600 MG PO TABS: 600.0000 mg | ORAL_TABLET | Freq: Four times a day (QID) | ORAL | 0 refills | Status: AC | PRN

## 2022-01-11 NOTE — ED Triage Notes (Signed)
Patient here with right leg pain on the back of his right thigh.  He states that the back of his leg is very sensitive to the touch.  He states that it might be a spider bite.  Patient has a white head at the back of his thigh.  CSMTs are intact.  Patient also has a cracked tooth on the bottom of left jaw, having pain there also.

## 2022-01-11 NOTE — Discharge Instructions (Addendum)
Please read and follow all provided instructions.  Your diagnoses today include:  1. Cutaneous abscess of right lower extremity   2. Pain, dental     Tests performed today include: Vital signs. See below for your results today.   Medications prescribed:  Clindamycin - antibiotic  You have been prescribed an antibiotic medicine: take the entire course of medicine even if you are feeling better. Stopping early can cause the antibiotic not to work.  Ibuprofen (Motrin, Advil) - anti-inflammatory pain medication Do not exceed 600mg  ibuprofen every 6 hours, take with food  You have been prescribed an anti-inflammatory medication or NSAID. Take with food. Take smallest effective dose for the shortest duration needed for your pain. Stop taking if you experience stomach pain or vomiting.    Take any prescribed medications only as directed.   Home care instructions:  Follow any educational materials contained in this packet  Follow-up instructions: Return to the Emergency Department in 48 hours for a recheck if your symptoms are not significantly improved.  Please follow-up with your primary care provider in the next 1 week for further evaluation of your symptoms.   Return instructions:  Return to the Emergency Department if you have: Fever Worsening symptoms Worsening pain Worsening swelling Redness of the skin that moves away from the affected area, especially if it streaks away from the affected area  Any other emergent concerns   Your vital signs today were: BP (!) 122/58   Pulse 62   Temp 98.1 F (36.7 C) (Oral)   Resp 18   SpO2 100%  If your blood pressure (BP) was elevated above 135/85 this visit, please have this repeated by your doctor within one month. --------------

## 2022-01-11 NOTE — ED Provider Notes (Signed)
[facility] EMERGENCY DEPARTMENT Provider Note   CSN: [id number] Arrival date & time: 01/11/22  9518     History  Chief Complaint  Patient presents with   Leg Pain   Abscess    Ricky Graham is a 31 y.o. male.  Patient with no seeming past medical history presents to the emergency department today for evaluation of right posterior thigh infection and abscess.  Symptoms started about 4 days ago.  He has never had anything like this in the past.  He has noted some drainage at times.  No fevers.  Pain radiates into the thigh.  He also reports history of jaw fracture and occasional swelling of the left lower mandible.  Currently symptoms are mild.  Tooth #19 is broken.       Home Medications Prior to Admission medications   Medication Sig Start Date End Date Taking? Authorizing Provider  alum & mag hydroxide-simeth (MAALOX ADVANCED MAX ST) 400-400-40 MG/5ML suspension Take 15 mLs by mouth every 6 (six) hours as needed for indigestion. 07/31/20   Mesner, Barbara Cower, MD  clindamycin (CLEOCIN) 300 MG capsule Take 1 capsule (300 mg total) by mouth 3 (three) times daily. 08/24/18   Serena Colonel, MD  famotidine (PEPCID) 20 MG tablet Take 1 tablet (20 mg total) by mouth 2 (two) times daily. 07/31/20   Mesner, Barbara Cower, MD  HYDROcodone-acetaminophen (NORCO) 7.5-325 MG tablet Take 1 tablet by mouth every 6 (six) hours as needed for moderate pain. 08/24/18   Serena Colonel, MD  pantoprazole (PROTONIX) 20 MG tablet Take 1 tablet (20 mg total) by mouth daily. 07/31/20   Mesner, Barbara Cower, MD  promethazine (PHENERGAN) 25 MG suppository Place 1 suppository (25 mg total) rectally every 6 (six) hours as needed for nausea or vomiting. 08/24/18   Serena Colonel, MD      Allergies    Patient has no known allergies.    Review of Systems   Review of Systems  Physical Exam Updated Vital Signs BP (!) 159/70 (BP Location: Right Arm)   Pulse 61   Temp 98.1 F (36.7 C) (Oral)   Resp 18   SpO2 98%    Physical Exam Vitals and nursing note reviewed.  Constitutional:      Appearance: He is well-developed.  HENT:     Head: Normocephalic and atraumatic.     Right Ear: External ear normal.     Left Ear: External ear normal.     Mouth/Throat:     Comments: Mild left lower jaw and facial swelling, no discrete abscess.  Tooth #19 is broken to the gumline. Eyes:     Conjunctiva/sclera: Conjunctivae normal.  Pulmonary:     Effort: No respiratory distress.  Musculoskeletal:     Cervical back: Normal range of motion and neck supple.  Skin:    General: Skin is warm and dry.     Comments: There is a 1 cm area of erythema and induration with minimal fluctuance to the right posterolateral thigh with several centimeters of surrounding induration and redness consistent with cellulitis.  No active drainage.  Area is tender to palpation.  Neurological:     Mental Status: He is alert.     ED Results / Procedures / Treatments   Labs (all labs ordered are listed, but only abnormal results are displayed) Labs Reviewed - No data to display  EKG None  Radiology VAS [place] LOWER EXTREMITY VENOUS (DVT) (ONLY [facility] & [facility])  Result Date: 01/11/2022  Lower Venous DVT Study  Patient Name:  Ricky Graham [facility]  Date of Exam:   01/11/2022 Medical Rec #: [id number]           Accession #:    [id number] Date of Birth: [birth year]          Patient Gender: M Patient Age:   83 years Exam Location:  [facility] Procedure:      VAS [place] LOWER EXTREMITY VENOUS (DVT) Referring Phys: Sharyn Lull --------------------------------------------------------------------------------  Indications: Pain.  Comparison Study: No prior study Performing Technologist: Maudry Mayhew MHA, RDMS, RVT, RDCS  Examination Guidelines: A complete evaluation includes B-mode imaging, spectral Doppler, color Doppler, and power Doppler as needed of all accessible portions of each vessel. Bilateral testing is considered an integral part of a  complete examination. Limited examinations for reoccurring indications may be performed as noted. The reflux portion of the exam is performed with the patient in reverse Trendelenburg.  +---------+---------------+---------+-----------+----------+--------------+ RIGHT    CompressibilityPhasicitySpontaneityPropertiesThrombus Aging +---------+---------------+---------+-----------+----------+--------------+ CFV      Full           Yes      Yes                                 +---------+---------------+---------+-----------+----------+--------------+ SFJ      Full                                                        +---------+---------------+---------+-----------+----------+--------------+ FV Prox  Full                                                        +---------+---------------+---------+-----------+----------+--------------+ FV Mid   Full                                                        +---------+---------------+---------+-----------+----------+--------------+ FV DistalFull                                                        +---------+---------------+---------+-----------+----------+--------------+ PFV      Full                                                        +---------+---------------+---------+-----------+----------+--------------+ POP      Full           Yes      Yes                                 +---------+---------------+---------+-----------+----------+--------------+ PTV      Full                                                        +---------+---------------+---------+-----------+----------+--------------+  PERO     Full                                                        +---------+---------------+---------+-----------+----------+--------------+   +----+---------------+---------+-----------+----------+--------------+ LEFTCompressibilityPhasicitySpontaneityPropertiesThrombus Aging  +----+---------------+---------+-----------+----------+--------------+ CFV Full           No       Yes                                 +----+---------------+---------+-----------+----------+--------------+     Summary: RIGHT: - There is no evidence of deep vein thrombosis in the lower extremity.  - No cystic structure found in the popliteal fossa.  LEFT: - No evidence of common femoral vein obstruction.  *See table(s) above for measurements and observations.    Preliminary     Procedures .Marland KitchenIncision and Drainage  Date/Time: 01/11/2022 2:00 PM  Performed by: Renne Crigler, PA-C Authorized by: Renne Crigler, PA-C   Consent:    Consent obtained:  Verbal   Consent given by:  Patient   Risks discussed:  Bleeding, incomplete drainage and pain   Alternatives discussed:  No treatment Universal protocol:    Patient identity confirmed:  Verbally with patient and provided demographic data Location:    Type:  Abscess   Size:  1cm   Location:  Lower extremity   Lower extremity location:  Leg   Leg location:  R upper leg Pre-procedure details:    Skin preparation:  Povidone-iodine Sedation:    Sedation type:  None Anesthesia:    Anesthesia method:  Local infiltration Procedure type:    Complexity:  Simple Procedure details:    Incision types:  Stab incision   Wound management:  Probed and deloculated   Drainage:  Purulent   Drainage amount:  Moderate   Wound treatment:  Wound left open   Packing materials:  None Post-procedure details:    Procedure completion:  Tolerated well, no immediate complications     Medications Ordered in ED Medications  oxyCODONE-acetaminophen (PERCOCET/ROXICET) 5-325 MG per tablet 1 tablet (has no administration in time range)  lidocaine-EPINEPHrine (XYLOCAINE W/EPI) 2 %-1:200000 (PF) injection 10 mL (has no administration in time range)  ibuprofen (ADVIL) tablet 800 mg (800 mg Oral Given 01/11/22 0608)    ED Course/ Medical Decision Making/ A&P     Patient seen and examined. History obtained directly from patient. Work-up including labs, imaging, EKG ordered in triage, if performed, were reviewed.    Labs/EKG: None ordered  Imaging: Right lower extremity Doppler, negative for DVT.  Results reviewed.  Medications/Fluids: Ordered: Lidocaine 2% with epinephrine, oral Percocet   Most recent vital signs reviewed and are as follows: BP (!) 159/70 (BP Location: Right Arm)   Pulse 61   Temp 98.1 F (36.7 C) (Oral)   Resp 18   SpO2 98%   Initial impression: Small abscess with surrounding cellulitis of the right thigh, possibly mild left lower jaw dental infection.  2:00 PM Reassessment performed. Patient appears well.  Tolerated procedure well.  Most current vital signs reviewed and are as follows: BP (!) 122/58   Pulse 62   Temp 98.1 F (36.7 C) (Oral)   Resp 18   SpO2 100%   Plan: Discharge to home.   Prescriptions written for: Clindamycin, ibuprofen  ED return instructions discussed: The patient was urged to return to the Emergency Department urgently with worsening pain, swelling, expanding erythema especially if it streaks away from the affected area, fever, or if they have any other concerns.   Follow-up instructions discussed: The patient was urged to return to the Emergency Department or go to their PCP in 48 hours for wound recheck if the area is not significantly improved.                           Medical Decision Making Risk Prescription drug management.   Patient with skin abscess amenable to incision and drainage. He has cellulitic component and is prescribed antibiotics for this.  Abscess drained.  No signs of necrotizing fasciitis or complicated infection.  Patient presents for dental pain, possible mild infection. They do not have a fever and do not appear septic. Exam unconcerning for Ludwig's angina or other deep tissue infection in neck and I do not feel that advanced imaging is indicated at this  time. Low suspicion for PTA, RPA, epiglottis based on exam.   Patient will be treated for dental infection with antibiotic. Encouraged tylenol/NSAIDs as prescribed or as directed on the packaging for pain. Encouraged follow-up with a dentist for definitive and long-term management.           Final Clinical Impression(s) / ED Diagnoses Final diagnoses:  Cutaneous abscess of right lower extremity  Pain, dental    Rx / DC Orders ED Discharge Orders          Ordered    clindamycin (CLEOCIN) 150 MG capsule  Every 6 hours        01/11/22 1358    ibuprofen (ADVIL) 600 MG tablet  Every 6 hours PRN        01/11/22 1358              Renne Crigler, PA-C 01/11/22 1403    Kommor, Wyn Forster, MD 01/11/22 1520

## 2022-01-11 NOTE — ED Provider Triage Note (Signed)
Emergency Medicine Provider Triage Evaluation Note  Ricky Graham , a 31 y.o. male  was evaluated in triage.  Pt complains of multiple complaints.  He complains of a possible spider bite or infection to his glue.  Denies any bites but has noticed some redness and swelling and has been is painful to the touch.  He also complains of pain to his right leg, feels like his "blood is clotting".  He states he has pain with walking.  He also states that he has a metal plate to his left jaw.  States that he feels like his jaw is slightly swollen.  He reports that this has happened in the past, and he has gotten "green pills "for it to go down.  Endorses chills.  No recent falls.  No known tick bites.  Review of Systems  Positive: As above Negative: As above  Physical Exam  BP 138/70 (BP Location: Right Arm)   Pulse 66   Temp 98 F (36.7 C)   Resp 17   SpO2 100%  Gen:   Awake, no distress   Resp:  Normal effort  MSK:   Moves extremities without difficulty  Other:  Visible abscess/area of cellulitis with some central induration and surrounding fluctuance.  No active drainage.  He has 2+ DP pulses and does not appear to have a swollen leg.  ENT exam without any significant swelling noted, he has a exposed molar to the left lower jaw but no fluctuant abscess.  No tripoding, no drooling, no change in phonation Medical Decision Making  Medically screening exam initiated at 5:47 AM.  Appropriate orders placed.  Ricky Graham was informed that the remainder of the evaluation will be completed by another provider, this initial triage assessment does not replace that evaluation, and the importance of remaining in the ED until their evaluation is complete.  Although I have low suspicion for DVT, given patient's concern, will order DVT study. No evidence of RPA/PTA. Abscess may be drainable but will need US guidance for evaluation   Ricky Balding, PA-C 01/11/22 3646

## 2022-01-11 NOTE — ED Notes (Signed)
Pt ambulatory in triage, pt c/o R leg pain, pt has aprox gum ball sized abscess on his R leg and also c/o L jaw swelling.  Pt has broken tooth and states that he has frequently taken a green pill to help with the swelling.

## 2022-01-11 NOTE — Progress Notes (Signed)
Right lower extremity venous duplex completed. Refer to "CV Proc" under chart review to view preliminary results.  01/11/2022 10:19 AM Kelby Aline., MHA, RVT, RDCS, RDMS

## 2022-10-13 ENCOUNTER — Other Ambulatory Visit: Payer: Self-pay

## 2022-10-13 ENCOUNTER — Emergency Department (HOSPITAL_COMMUNITY)
Admission: EM | Admit: 2022-10-13 | Discharge: 2022-10-13 | Disposition: A | Discharge status: Home / Self Care | Payer: Medicaid Other | Attending: Emergency Medicine | Admitting: Emergency Medicine

## 2022-10-13 ENCOUNTER — Encounter (HOSPITAL_COMMUNITY): Payer: Self-pay

## 2022-10-13 ENCOUNTER — Emergency Department (HOSPITAL_COMMUNITY): Payer: Medicaid Other

## 2022-10-13 DIAGNOSIS — S92212A Displaced fracture of cuboid bone of left foot, initial encounter for closed fracture: Secondary | ICD-10-CM | POA: Insufficient documentation

## 2022-10-13 DIAGNOSIS — X500XXA Overexertion from strenuous movement or load, initial encounter: Secondary | ICD-10-CM | POA: Insufficient documentation

## 2022-10-13 DIAGNOSIS — S82892A Other fracture of left lower leg, initial encounter for closed fracture: Secondary | ICD-10-CM

## 2022-10-13 DIAGNOSIS — S99912A Unspecified injury of left ankle, initial encounter: Secondary | ICD-10-CM | POA: Diagnosis present

## 2022-10-13 MED ORDER — OXYCODONE-ACETAMINOPHEN 5-325 MG PO TABS
1.0000 | ORAL_TABLET | Freq: Once | ORAL | Status: AC
  Administered 2022-10-13: 1 via ORAL
  Filled 2022-10-13: qty 1

## 2022-10-13 MED ORDER — HYDROCODONE-ACETAMINOPHEN 5-325 MG PO TABS: 1.0000 | ORAL_TABLET | Freq: Four times a day (QID) | ORAL | 0 refills | Status: AC | PRN

## 2022-10-13 NOTE — Progress Notes (Signed)
Orthopedic Tech Progress Note Patient Details:  Ricky Graham 1991-03-18 [id number]  Ortho Devices Type of Ortho Device: Crutches, CAM walker Ortho Device/Splint Location: LLE Ortho Device/Splint Interventions: Application, Ordered, Adjustment   Post Interventions Patient Tolerated: Well Instructions Provided: Care of device  [name] A Gerhardt Gleed 10/13/2022, 9:45 AM

## 2022-10-13 NOTE — ED Provider Notes (Signed)
[facility] AT [facility] Provider Note   CSN: [id number] Arrival date & time: 10/13/22  1308     History  Chief Complaint  Patient presents with   Ankle Injury    Ricky Graham is a 32 y.o. male.  The history is provided by the patient and medical records. No language interpreter was used.  Ankle Injury     32 year old male presenting for evaluation of ankle injury.  Patient reports this morning he was trying to get ready to go to work, he was went up to step when he twisted his left ankle follows with acute pain about the ankle/foot.  He noticed increased swelling since the injury and having difficulty bearing weight on his affected foot.  He denies any knee or hip pain.  He is having trouble moving his toes after the injury with some slight tingling sensation to his fourth and fifth toe.  Home Medications Prior to Admission medications   Medication Sig Start Date End Date Taking? Authorizing Provider  alum & mag hydroxide-simeth (MAALOX ADVANCED MAX ST) 400-400-40 MG/5ML suspension Take 15 mLs by mouth every 6 (six) hours as needed for indigestion. 07/31/20   Mesner, Barbara Cower, MD  clindamycin (CLEOCIN) 150 MG capsule Take 2 capsules (300 mg total) by mouth every 6 (six) hours. 01/11/22   Renne Crigler, PA-C  famotidine (PEPCID) 20 MG tablet Take 1 tablet (20 mg total) by mouth 2 (two) times daily. 07/31/20   Mesner, Barbara Cower, MD  ibuprofen (ADVIL) 600 MG tablet Take 1 tablet (600 mg total) by mouth every 6 (six) hours as needed. 01/11/22   Renne Crigler, PA-C  pantoprazole (PROTONIX) 20 MG tablet Take 1 tablet (20 mg total) by mouth daily. 07/31/20   Mesner, Barbara Cower, MD      Allergies    Patient has no known allergies.    Review of Systems   Review of Systems  All other systems reviewed and are negative.   Physical Exam Updated Vital Signs BP 115/70 (BP Location: Right Arm)   Pulse 93   Temp 98.1 F (36.7 C) (Oral)   Resp 20   Ht 6' (1.829  m)   Wt 77.1 kg   SpO2 100%   BMI 23.06 kg/m  Physical Exam Vitals and nursing note reviewed.  Constitutional:      General: He is not in acute distress.    Appearance: He is well-developed.     Comments: Awake, alert, nontoxic appearance  HENT:     Head: Atraumatic.  Eyes:     General:        Right eye: No discharge.        Left eye: No discharge.     Conjunctiva/sclera: Conjunctivae normal.  Pulmonary:     Effort: Pulmonary effort is normal.  Chest:     Chest wall: No tenderness.  Abdominal:     Tenderness: There is no abdominal tenderness. There is no rebound.  Musculoskeletal:        General: Tenderness (Left foot: Edema noted to the dorsum of the foot laterally and proximally near the lateral malleoli region.  Ankle with full range of motion, dorsalis pedis pulse palpable with brisk cap refills to all toes.) present.     Cervical back: Neck supple.     Comments: Baseline ROM, no obvious new focal weakness  Skin:    Findings: No rash.  Neurological:     Mental Status: He is alert.     Comments: Mental status  and motor strength appears baseline for patient and situation     ED Results / Procedures / Treatments   Labs (all labs ordered are listed, but only abnormal results are displayed) Labs Reviewed - No data to display  EKG None  Radiology DG Ankle Complete Left  Result Date: 10/13/2022 CLINICAL DATA:  Posttraumatic left ankle pain EXAM: LEFT ANKLE COMPLETE - 3 VIEW COMPARISON:  None Available. FINDINGS: Tiny avulsion fracture near the calcaneocuboid joint, usually attributed to extensor digitorum brevis avulsion fracture. The ankle is intact and normally aligned. Negative for ankle joint effusion. IMPRESSION: Tiny avulsion fracture near the calcaneocuboid joint, usually attributed to extensor digitorum brevis avulsion fracture. Electronically Signed   By: Tiburcio Pea M.D.   On: 10/13/2022 07:51    Procedures Procedures    Medications Ordered in  ED Medications  oxyCODONE-acetaminophen (PERCOCET/ROXICET) 5-325 MG per tablet 1 tablet (1 tablet Oral Given 10/13/22 0808)    ED Course/ Medical Decision Making/ A&P                             Medical Decision Making Amount and/or Complexity of Data Reviewed Radiology: ordered.   BP 115/70 (BP Location: Right Arm)   Pulse 93   Temp 98.1 F (36.7 C) (Oral)   Resp 20   Ht 6' (1.829 m)   Wt 77.1 kg   SpO2 100%   BMI 23.06 kg/m   18:33 AM  32 year old male presenting for evaluation of ankle injury.  Patient reports this morning he was trying to get ready to go to work, he was went up to step when he twisted his left ankle follows with acute pain about the ankle/foot.  He noticed increased swelling since the injury and having difficulty bearing weight on his affected foot.  He denies any knee or hip pain.  He is having trouble moving his toes after the injury with some slight tingling sensation to his fourth and fifth toe.  On exam patient is laying bed appears to be in no acute discomfort.  Left foot/ankle remarkable for edema and tenderness to dorsum of the foot approximately adjacent to the lateral malleoli region.  Patient is neurovascular intact no crepitus noted.  X-ray of the left ankle was obtained independently viewed and interpreted by me and remarkable for a tiny avulsion fracture near the calcaneocuboid joint.  I agree with radiologist interpretation.  This is a closed injury.  Will place patient in a cam walker boot, crutches provided, this is a weightbearing as tolerated injury.  Patient was given Percocet for pain with improvement of symptoms.  DDx: Strain, sprain, fracture, dislocation  Social determinant of health including tobacco use.  Will give referral to orthopedist as well as provide work note per request.        Final Clinical Impression(s) / ED Diagnoses Final diagnoses:  Closed avulsion fracture of left ankle, initial encounter    Rx / DC  Orders ED Discharge Orders          Ordered    HYDROcodone-acetaminophen (NORCO/VICODIN) 5-325 MG tablet  Every 6 hours PRN        10/13/22 0820              Fayrene Helper, PA-C 10/13/22 1478    Cathren Laine, MD 10/13/22 1332

## 2022-10-13 NOTE — Discharge Instructions (Addendum)
You have been evaluated for your injury.  You have a small fracture in your left ankle.  You may wear cam walker boot for protection, you may weightbearing as tolerated and use crutches to assist with your ambulation.  Follow-up with orthopedic doctor for further care.  Return if you have any concern.  You may take opiate pain medication as needed but be aware that it can cause drowsiness.

## 2022-10-13 NOTE — ED Triage Notes (Signed)
Was getting ready for work about 4am and rolled left ankle.

## 2022-12-07 ENCOUNTER — Other Ambulatory Visit (HOSPITAL_COMMUNITY): Payer: Self-pay
